# Patient Record
Sex: Female | Born: 1995 | Race: White | Hispanic: No | Marital: Single | Smoking: Never smoker
Health system: Southern US, Community
[De-identification: ages and names within clinical notes are randomized; demographics above are authoritative.]

## PROBLEM LIST (undated history)

## (undated) DIAGNOSIS — Q6689 Other  specified congenital deformities of feet: Secondary | ICD-10-CM

## (undated) HISTORY — PX: CLUB FOOT RELEASE: SHX1363

---

## 2020-02-22 ENCOUNTER — Encounter (HOSPITAL_COMMUNITY): Payer: Self-pay

## 2020-02-22 ENCOUNTER — Other Ambulatory Visit: Payer: Self-pay

## 2020-02-22 ENCOUNTER — Emergency Department (HOSPITAL_COMMUNITY): Payer: 59

## 2020-02-22 ENCOUNTER — Emergency Department (HOSPITAL_COMMUNITY)
Admission: EM | Admit: 2020-02-22 | Discharge: 2020-02-22 | Disposition: A | Discharge status: Home / Self Care | Payer: 59 | Attending: Emergency Medicine | Admitting: Emergency Medicine

## 2020-02-22 DIAGNOSIS — S82841A Displaced bimalleolar fracture of right lower leg, initial encounter for closed fracture: Secondary | ICD-10-CM | POA: Insufficient documentation

## 2020-02-22 DIAGNOSIS — S70312A Abrasion, left thigh, initial encounter: Secondary | ICD-10-CM | POA: Diagnosis not present

## 2020-02-22 DIAGNOSIS — T148XXA Other injury of unspecified body region, initial encounter: Secondary | ICD-10-CM

## 2020-02-22 DIAGNOSIS — S99911A Unspecified injury of right ankle, initial encounter: Secondary | ICD-10-CM | POA: Diagnosis present

## 2020-02-22 DIAGNOSIS — W231XXA Caught, crushed, jammed, or pinched between stationary objects, initial encounter: Secondary | ICD-10-CM | POA: Diagnosis not present

## 2020-02-22 HISTORY — DX: Other specified congenital deformities of feet: Q66.89

## 2020-02-22 MED ORDER — IBUPROFEN 400 MG PO TABS
600.0000 mg | ORAL_TABLET | Freq: Once | ORAL | Status: AC
  Administered 2020-02-22: 600 mg via ORAL
  Filled 2020-02-22: qty 1

## 2020-02-22 MED ORDER — OXYCODONE-ACETAMINOPHEN 5-325 MG PO TABS
1.0000 | ORAL_TABLET | Freq: Once | ORAL | Status: AC
  Administered 2020-02-22: 1 via ORAL
  Filled 2020-02-22: qty 1

## 2020-02-22 MED ORDER — HYDROMORPHONE HCL 1 MG/ML IJ SOLN
1.0000 mg | Freq: Once | INTRAMUSCULAR | Status: AC
  Administered 2020-02-22: 1 mg via INTRAVENOUS
  Filled 2020-02-22: qty 1

## 2020-02-22 MED ORDER — METHOCARBAMOL 500 MG PO TABS
500.0000 mg | ORAL_TABLET | Freq: Once | ORAL | Status: AC
  Administered 2020-02-22: 500 mg via ORAL
  Filled 2020-02-22: qty 1

## 2020-02-22 MED ORDER — METHOCARBAMOL 500 MG PO TABS: 500.0000 mg | ORAL_TABLET | Freq: Two times a day (BID) | ORAL | 0 refills | Status: AC

## 2020-02-22 MED ORDER — OXYCODONE-ACETAMINOPHEN 5-325 MG PO TABS
1.0000 | ORAL_TABLET | Freq: Three times a day (TID) | ORAL | 0 refills | Status: DC | PRN
Start: 2020-02-22 — End: 2020-02-29

## 2020-02-22 NOTE — Progress Notes (Signed)
Orthopedic Tech Progress Note Patient Details:  Rachelle Edwards 03-12-1996 [id number]  Ortho Devices Type of Ortho Device: Post (short leg) splint, Crutches Ortho Device/Splint Location: RLE Ortho Device/Splint Interventions: Application, Adjustment   Post Interventions Patient Tolerated: Well Instructions Provided: Care of device, Poper ambulation with device   Kamori Barbier E Keinan Brouillet 02/22/2020, 10:48 PM

## 2020-02-22 NOTE — Discharge Instructions (Addendum)
Your x-ray shows that you have a fracture of your right ankle. You are placed in a splint and you will need to use crutches as you cannot put weight on this leg. You will need to follow-up with orthopedic specialist listed below and give them a call tomorrow to schedule an appointment. Take the pain medicine as needed along with ibuprofen to help with pain and swelling. Return to the ER if you start to experience worsening pain, additional injuries, numbness or weakness.

## 2020-02-22 NOTE — ED Provider Notes (Signed)
[facility] EMERGENCY DEPARTMENT Provider Note   CSN: [id number] Arrival date & time: 02/22/20  1831     History Chief Complaint  Patient presents with  . Ankle Pain    Kayla Mora is a 24 y.o. 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  States that a brick wall fell onto her legs.  She has had immediate right ankle swelling and pain worse with weightbearing since the injury.  She denies any other procedures or dislocations in the area.  States that her boyfriend gave her a pain pill that did help.  Denies any other injuries, does have some abrasions to the left thigh and leg.  Denies any head injury, loss of consciousness, numbness or weakness. Denies possibility of pregnancy.  HPI     Past Medical History:  Diagnosis Date  . Club foot     There are no problems to display for this patient.   History reviewed. No pertinent surgical history.   OB History   No obstetric history on file.     No family history on file.  Social History   Tobacco Use  . Smoking status: Not on file  Substance Use Topics  . Alcohol use: Not on file  . Drug use: Not on file    Home Medications Prior to Admission medications   Medication Sig Start Date End Date Taking? Authorizing Provider  methocarbamol (ROBAXIN) 500 MG tablet Take 1 tablet (500 mg total) by mouth 2 (two) times daily. 02/22/20   Jalicia Roszak, PA-C  oxyCODONE-acetaminophen (PERCOCET/ROXICET) 5-325 MG tablet Take 1 tablet by mouth every 8 (eight) hours as needed for severe pain. 02/22/20   Dietrich Pates, PA-C    Allergies    Patient has no known allergies.  Review of Systems   Review of Systems  Constitutional: Negative for appetite change, chills and fever.  HENT: Negative for ear pain, rhinorrhea, sneezing and sore throat.   Eyes: Negative for photophobia and  visual disturbance.  Respiratory: Negative for cough, chest tightness, shortness of breath and wheezing.   Cardiovascular: Negative for chest pain and palpitations.  Gastrointestinal: Negative for abdominal pain, blood in stool, constipation, diarrhea, nausea and vomiting.  Genitourinary: Negative for dysuria, hematuria and urgency.  Musculoskeletal: Positive for arthralgias and joint swelling. Negative for myalgias.  Skin: Positive for wound. Negative for rash.  Neurological: Negative for dizziness, weakness and light-headedness.    Physical Exam Updated Vital Signs BP 111/84   Pulse 86   Temp 98.4 F (36.9 C) (Oral)   Resp 11   Ht 5\' 4"  (1.626 m)   Wt 56.7 kg   LMP 02/07/2020 (Approximate)   SpO2 (!) 88%   BMI 21.46 kg/m   Physical Exam Vitals and nursing note reviewed.  Constitutional:      General: She is not in acute distress.    Appearance: She is well-developed.  HENT:     Head: Normocephalic and atraumatic.     Nose: Nose normal.  Eyes:     General: No scleral icterus.       Left eye: No discharge.     Conjunctiva/sclera: Conjunctivae normal.  Cardiovascular:     Rate and Rhythm: Normal rate and regular rhythm.     Heart sounds: Normal heart sounds. No murmur heard.  No friction rub. No gallop.   Pulmonary:  Effort: Pulmonary effort is normal. No respiratory distress.     Breath sounds: Normal breath sounds.  Abdominal:     General: Bowel sounds are normal. There is no distension.     Palpations: Abdomen is soft.     Tenderness: There is no abdominal tenderness. There is no guarding.  Musculoskeletal:        General: Swelling, tenderness and deformity present. Normal range of motion.     Cervical back: Normal range of motion and neck supple.     Comments: Significant edema and tenderness of the right ankle.  There is an abrasion on the right shin and left thigh.  2+ DP pulse palpated bilaterally.  Able to move digits without difficulty. Normal sensation to  light touch of bilateral lower extremities.  Skin:    General: Skin is warm and dry.     Findings: Abrasion present. No rash.  Neurological:     Mental Status: She is alert.     Motor: No abnormal muscle tone.     Coordination: Coordination normal.       ED Results / Procedures / Treatments   Labs (all labs ordered are listed, but only abnormal results are displayed) Labs Reviewed - No data to display  EKG None  Radiology DG Ankle Complete Right  Result Date: 02/22/2020 CLINICAL DATA:  A small brick wall toppled onto the patient's right ankle and lower leg today. There is an abrasion that goes from the anterior midshaft tibia down to the lateral right ankle. Right ankle is swollen and pt c/o lateral ankle pain. Pt states she has a hx of club foot on the right side and had surgery as a small child on the right foot. EXAM: RIGHT ANKLE - COMPLETE 3+ VIEW COMPARISON:  None. FINDINGS: Bimalleolar fracture. There is an oblique fracture of the distal fibula, mildly comminuted, displaced laterally by 7 mm, distal fragment also mildly angulated laterally. Transverse fracture extends across the base of the medial malleolus, displaced medially by 4 mm. The talus is mildly subluxed laterally, 4-5 mm. No other fractures. There is surrounding soft tissue swelling. IMPRESSION: 1. Fractures of the distal fibula and medial malleolus, displaced with mild, up approximately 4-5 mm, of lateral talar subluxation. Electronically Signed   By: Amie Portland M.D.   On: 02/22/2020 20:16   DG Tibia/Fibula Right Port  Result Date: 02/22/2020 CLINICAL DATA:  History of bimalleolar fracture with reduction EXAM: PORTABLE RIGHT TIBIA AND FIBULA - 2 VIEW COMPARISON:  Films from earlier in the same day. FINDINGS: Splinting material is noted in place. The bimalleolar fracture is again identified and mildly reduced. No new focal abnormality is seen. IMPRESSION: Status post reduction and splinting of bimalleolar fracture  Electronically Signed   By: Alcide Clever M.D.   On: 02/22/2020 22:39    Procedures Procedures (including critical care time)  Medications Ordered in ED Medications  ibuprofen (ADVIL) tablet 600 mg (has no administration in time range)  oxyCODONE-acetaminophen (PERCOCET/ROXICET) 5-325 MG per tablet 1 tablet (has no administration in time range)  methocarbamol (ROBAXIN) tablet 500 mg (has no administration in time range)  HYDROmorphone (DILAUDID) injection 1 mg (1 mg Intravenous Given 02/22/20 2132)    ED Course  I have reviewed the triage vital signs and the nursing notes.  Pertinent labs & imaging results that were available during my care of the patient were reviewed by me and considered in my medical decision making (see chart for details).  Clinical Course as of Feb 21 2306  Wed Feb 22, 2020  2210 Spoke to Dr. Jena Gauss, orthopedic surgeon who recommends short leg splint and follow-up in the office to plan for surgery. No reduction needed.   [HK]    Clinical Course User Index [HK] Dietrich Pates, PA-C   MDM Rules/Calculators/A&P                          24 year old female presenting to the ED with right ankle injury that occurred prior to arrival.  She states that a brick wall fell onto her right leg.  She has had pain and swelling since then as well as difficulty putting weight on the extremity.  She did have surgery on this foot for clubfoot at the age of 1.  She denies any other injuries from the accident.  No head injury, loss of consciousness, numbness or weakness.  She has diffuse edema and tenderness of the right ankle with limited range of motion.  Areas neurovascularly intact but there is a large abrasion overlying the ankle and shin.  Equal and intact distal pulses bilaterally.  Superficial abrasions noted to the left thigh as well.  X-ray of the ankle shows right bimalleolar fracture with talar subluxation.  Per Dr. Jena Gauss recommendation we will place in a short leg splint, give  crutches and have her follow-up in the office for surgical planning.  X-ray of the tib-fib shows no other fractures.  Will discharge with crutches and follow-up instructions.  Pain controlled here.  Return precautions given.  All imaging, if done today, including plain films, CT scans, and ultrasounds, independently reviewed by me, and interpretations confirmed via formal radiology reads.  Patient is hemodynamically stable, in NAD. Evaluation does not show pathology that would require ongoing emergent intervention or inpatient treatment. I explained the diagnosis to the patient. Pain has been managed and has no complaints prior to discharge. Patient is comfortable with above plan and is stable for discharge at this time. All questions were answered prior to disposition. Strict return precautions for returning to the ED were discussed. Encouraged follow up with PCP.   An After Visit Summary was printed and given to the patient.   Portions of this note were generated with Scientist, clinical (histocompatibility and immunogenetics). Dictation errors may occur despite best attempts at proofreading.  Final Clinical Impression(s) / ED Diagnoses Final diagnoses:  Fracture  Closed bimalleolar fracture of right ankle, initial encounter    Rx / DC Orders ED Discharge Orders         Ordered    oxyCODONE-acetaminophen (PERCOCET/ROXICET) 5-325 MG tablet  Every 8 hours PRN        02/22/20 2302    methocarbamol (ROBAXIN) 500 MG tablet  2 times daily        02/22/20 2302           Dietrich Pates, PA-C 02/22/20 2307    Milagros Loll, MD 02/23/20 1222

## 2020-02-22 NOTE — ED Notes (Signed)
Patient verbalizes understanding of discharge instructions. Opportunity for questioning and answers were provided. Armband removed by staff, pt discharged from ED via wheelchair.  

## 2020-02-22 NOTE — ED Triage Notes (Signed)
Pt states that a brick wall fell onto her legs, lots of swelling to R ankle and abrasion noted.

## 2020-02-23 ENCOUNTER — Ambulatory Visit (INDEPENDENT_AMBULATORY_CARE_PROVIDER_SITE_OTHER): Payer: 59 | Admitting: Orthopaedic Surgery

## 2020-02-23 ENCOUNTER — Encounter (HOSPITAL_BASED_OUTPATIENT_CLINIC_OR_DEPARTMENT_OTHER): Payer: Self-pay | Admitting: Orthopaedic Surgery

## 2020-02-23 ENCOUNTER — Encounter: Payer: Self-pay | Admitting: Orthopaedic Surgery

## 2020-02-23 ENCOUNTER — Other Ambulatory Visit: Payer: Self-pay

## 2020-02-23 DIAGNOSIS — S82841A Displaced bimalleolar fracture of right lower leg, initial encounter for closed fracture: Secondary | ICD-10-CM | POA: Diagnosis not present

## 2020-02-23 MED ORDER — HYDROCODONE-ACETAMINOPHEN 7.5-325 MG PO TABS
1.0000 | ORAL_TABLET | Freq: Two times a day (BID) | ORAL | 0 refills | Status: DC | PRN
Start: 2020-02-23 — End: 2020-02-29

## 2020-02-23 NOTE — Progress Notes (Signed)
Office Visit Note   Patient: Kayla Mora           Date of Birth: 12-15-1995           MRN: [id number] Visit Date: 02/23/2020              Requested by: No referring provider defined for this encounter. PCP: Patient, No Pcp Per   Assessment & Plan: Visit Diagnoses:  1. Bimalleolar ankle fracture, right, closed, initial encounter     Plan: Impression is displaced right bimalleolar ankle fracture.  This will need surgical repair and stabilization.  We went over the risks benefits rehab recovery of the surgery.  We placed her back in the splint.  Documentation was given for her upcoming flight to [place].  We will plan to do the surgery next week once the swelling has improved.  Norco was prescribed today.  She will keep her leg elevated above the heart at all times.  Questions encouraged and answered.  Follow-Up Instructions: No follow-ups on file.   Orders:  No orders of the defined types were placed in this encounter.  Meds ordered this encounter  Medications   HYDROcodone-acetaminophen (NORCO) 7.5-325 MG tablet    Sig: Take 1-2 tablets by mouth 2 (two) times daily as needed for moderate pain.    Dispense:  20 tablet    Refill:  0      Procedures: No procedures performed   Clinical Data: No additional findings.   Subjective: Chief Complaint  Patient presents with   Right Ankle - Injury    DOI 02/22/2020    Kayla Mora is a very pleasant 24 year old female ER follow-up from last night for acute injury to the right ankle.  She had a retaining brick wall fall directly onto her right leg and she suffered a bimalleolar ankle fracture with multiple skin abrasions.  She was placed in a splint in the ER as she follows up today.  She has had clubfoot surgery as a child.  She is a normal ambulator.  She does work from home.   Review of Systems  Constitutional: Negative.   HENT: Negative.   Eyes: Negative.   Respiratory: Negative.   Cardiovascular: Negative.   Endocrine:  Negative.   Musculoskeletal: Negative.   Neurological: Negative.   Hematological: Negative.   Psychiatric/Behavioral: Negative.   All other systems reviewed and are negative.    Objective: Vital Signs: Ht 5\' 4"  (1.626 m)    Wt 125 lb (56.7 kg)    LMP 02/07/2020 (Approximate)    BMI 21.46 kg/m   Physical Exam Vitals and nursing note reviewed.  Constitutional:      Appearance: She is well-developed.  Pulmonary:     Effort: Pulmonary effort is normal.  Skin:    General: Skin is warm.     Capillary Refill: Capillary refill takes less than 2 seconds.  Neurological:     Mental Status: She is alert and oriented to person, place, and time.  Psychiatric:        Behavior: Behavior normal.        Thought Content: Thought content normal.        Judgment: Judgment normal.     Ortho Exam Right lower extremity shows large anterior abrasion to the leg without any signs of infection.  The ankle fracture is closed.  She does have significant swelling.  The skin does not wrinkle.  No neurovascular compromise.  Compartments are soft. Specialty Comments:  No specialty comments available.  Imaging:  DG Ankle Complete Right  Result Date: 02/22/2020 CLINICAL DATA:  A small brick wall toppled onto the patient's right ankle and lower leg today. There is an abrasion that goes from the anterior midshaft tibia down to the lateral right ankle. Right ankle is swollen and pt c/o lateral ankle pain. Pt states she has a hx of club foot on the right side and had surgery as a small child on the right foot. EXAM: RIGHT ANKLE - COMPLETE 3+ VIEW COMPARISON:  None. FINDINGS: Bimalleolar fracture. There is an oblique fracture of the distal fibula, mildly comminuted, displaced laterally by 7 mm, distal fragment also mildly angulated laterally. Transverse fracture extends across the base of the medial malleolus, displaced medially by 4 mm. The talus is mildly subluxed laterally, 4-5 mm. No other fractures. There is  surrounding soft tissue swelling. IMPRESSION: 1. Fractures of the distal fibula and medial malleolus, displaced with mild, up approximately 4-5 mm, of lateral talar subluxation. Electronically Signed   By: Amie Portland M.D.   On: 02/22/2020 20:16   DG Tibia/Fibula Right Port  Result Date: 02/22/2020 CLINICAL DATA:  History of bimalleolar fracture with reduction EXAM: PORTABLE RIGHT TIBIA AND FIBULA - 2 VIEW COMPARISON:  Films from earlier in the same day. FINDINGS: Splinting material is noted in place. The bimalleolar fracture is again identified and mildly reduced. No new focal abnormality is seen. IMPRESSION: Status post reduction and splinting of bimalleolar fracture Electronically Signed   By: Alcide Clever M.D.   On: 02/22/2020 22:39     PMFS History: Patient Active Problem List   Diagnosis Date Noted   Bimalleolar ankle fracture, right, closed, initial encounter 02/23/2020   Past Medical History:  Diagnosis Date   Club foot     History reviewed. No pertinent family history.  History reviewed. No pertinent surgical history. Social History   Occupational History   Not on file  Tobacco Use   Smoking status: Not on file  Substance and Sexual Activity   Alcohol use: Not on file   Drug use: Not on file   Sexual activity: Not on file

## 2020-02-24 ENCOUNTER — Other Ambulatory Visit: Payer: Self-pay

## 2020-02-25 ENCOUNTER — Other Ambulatory Visit (HOSPITAL_COMMUNITY)
Admission: RE | Admit: 2020-02-25 | Discharge: 2020-02-25 | Disposition: A | Discharge status: Home / Self Care | Payer: 59 | Source: Ambulatory Visit | Attending: Orthopaedic Surgery | Admitting: Orthopaedic Surgery

## 2020-02-25 DIAGNOSIS — Z20822 Contact with and (suspected) exposure to covid-19: Secondary | ICD-10-CM | POA: Diagnosis not present

## 2020-02-25 DIAGNOSIS — Z01812 Encounter for preprocedural laboratory examination: Secondary | ICD-10-CM | POA: Insufficient documentation

## 2020-02-25 LAB — SARS CORONAVIRUS 2 (TAT 6-24 HRS): SARS Coronavirus 2: NEGATIVE

## 2020-02-29 ENCOUNTER — Encounter (HOSPITAL_BASED_OUTPATIENT_CLINIC_OR_DEPARTMENT_OTHER): Payer: Self-pay | Admitting: Orthopaedic Surgery

## 2020-02-29 ENCOUNTER — Other Ambulatory Visit: Payer: Self-pay

## 2020-02-29 ENCOUNTER — Ambulatory Visit (HOSPITAL_BASED_OUTPATIENT_CLINIC_OR_DEPARTMENT_OTHER): Payer: 59 | Admitting: Anesthesiology

## 2020-02-29 ENCOUNTER — Ambulatory Visit (HOSPITAL_COMMUNITY): Payer: 59

## 2020-02-29 ENCOUNTER — Encounter (HOSPITAL_BASED_OUTPATIENT_CLINIC_OR_DEPARTMENT_OTHER)
Admission: RE | Disposition: A | Discharge status: Home / Self Care | Payer: Self-pay | Source: Home / Self Care | Attending: Orthopaedic Surgery

## 2020-02-29 ENCOUNTER — Ambulatory Visit (HOSPITAL_BASED_OUTPATIENT_CLINIC_OR_DEPARTMENT_OTHER)
Admission: RE | Admit: 2020-02-29 | Discharge: 2020-02-29 | Disposition: A | Discharge status: Home / Self Care | Payer: 59 | Attending: Orthopaedic Surgery | Admitting: Orthopaedic Surgery

## 2020-02-29 DIAGNOSIS — X58XXXA Exposure to other specified factors, initial encounter: Secondary | ICD-10-CM | POA: Diagnosis not present

## 2020-02-29 DIAGNOSIS — Z419 Encounter for procedure for purposes other than remedying health state, unspecified: Secondary | ICD-10-CM

## 2020-02-29 DIAGNOSIS — S82841A Displaced bimalleolar fracture of right lower leg, initial encounter for closed fracture: Secondary | ICD-10-CM | POA: Insufficient documentation

## 2020-02-29 HISTORY — PX: ORIF ANKLE FRACTURE: SHX5408

## 2020-02-29 LAB — POCT PREGNANCY, URINE: Preg Test, Ur: NEGATIVE

## 2020-02-29 SURGERY — OPEN REDUCTION INTERNAL FIXATION (ORIF) ANKLE FRACTURE
Anesthesia: Regional | Site: Ankle | Laterality: Right

## 2020-02-29 MED ORDER — PROPOFOL 500 MG/50ML IV EMUL
INTRAVENOUS | Status: AC
  Filled 2020-02-29: qty 50

## 2020-02-29 MED ORDER — PROPOFOL 10 MG/ML IV BOLUS
INTRAVENOUS | Status: DC | PRN
  Administered 2020-02-29: 125 mg via INTRAVENOUS
  Administered 2020-02-29: 75 mg via INTRAVENOUS

## 2020-02-29 MED ORDER — LACTATED RINGERS IV SOLN: INTRAVENOUS | Status: DC

## 2020-02-29 MED ORDER — MIDAZOLAM HCL 2 MG/2ML IJ SOLN
2.0000 mg | Freq: Once | INTRAMUSCULAR | Status: AC
  Administered 2020-02-29: 2 mg via INTRAVENOUS

## 2020-02-29 MED ORDER — PROPOFOL 500 MG/50ML IV EMUL
INTRAVENOUS | Status: DC | PRN
  Administered 2020-02-29: 25 ug/kg/min via INTRAVENOUS

## 2020-02-29 MED ORDER — DEXAMETHASONE SODIUM PHOSPHATE 10 MG/ML IJ SOLN
INTRAMUSCULAR | Status: AC
  Filled 2020-02-29: qty 1

## 2020-02-29 MED ORDER — FENTANYL CITRATE (PF) 100 MCG/2ML IJ SOLN
100.0000 ug | Freq: Once | INTRAMUSCULAR | Status: AC
  Administered 2020-02-29: 100 ug via INTRAVENOUS

## 2020-02-29 MED ORDER — HYDROCODONE-ACETAMINOPHEN 7.5-325 MG PO TABS
1.0000 | ORAL_TABLET | Freq: Three times a day (TID) | ORAL | 0 refills | Status: DC | PRN
Start: 2020-02-29 — End: 2020-03-08

## 2020-02-29 MED ORDER — FENTANYL CITRATE (PF) 100 MCG/2ML IJ SOLN
INTRAMUSCULAR | Status: DC | PRN
  Administered 2020-02-29 (×2): 50 ug via INTRAVENOUS

## 2020-02-29 MED ORDER — CEFAZOLIN SODIUM-DEXTROSE 2-4 GM/100ML-% IV SOLN
INTRAVENOUS | Status: AC
  Filled 2020-02-29: qty 100

## 2020-02-29 MED ORDER — CALCIUM CARBONATE-VITAMIN D 500-200 MG-UNIT PO TABS: 1.0000 | ORAL_TABLET | Freq: Three times a day (TID) | ORAL | 6 refills | Status: AC

## 2020-02-29 MED ORDER — 0.9 % SODIUM CHLORIDE (POUR BTL) OPTIME
TOPICAL | Status: DC | PRN
  Administered 2020-02-29: 1000 mL

## 2020-02-29 MED ORDER — MIDAZOLAM HCL 5 MG/5ML IJ SOLN
INTRAMUSCULAR | Status: DC | PRN
  Administered 2020-02-29 (×2): 1 mg via INTRAVENOUS

## 2020-02-29 MED ORDER — LIDOCAINE 2% (20 MG/ML) 5 ML SYRINGE
INTRAMUSCULAR | Status: AC
  Filled 2020-02-29: qty 5

## 2020-02-29 MED ORDER — GLYCOPYRROLATE 0.2 MG/ML IJ SOLN
INTRAMUSCULAR | Status: DC | PRN
  Administered 2020-02-29: .2 mg via INTRAVENOUS

## 2020-02-29 MED ORDER — ASPIRIN EC 81 MG PO TBEC: 81.0000 mg | DELAYED_RELEASE_TABLET | Freq: Two times a day (BID) | ORAL | 0 refills | Status: DC

## 2020-02-29 MED ORDER — FENTANYL CITRATE (PF) 100 MCG/2ML IJ SOLN
INTRAMUSCULAR | Status: AC
  Filled 2020-02-29: qty 2

## 2020-02-29 MED ORDER — MIDAZOLAM HCL 2 MG/2ML IJ SOLN
INTRAMUSCULAR | Status: AC
  Filled 2020-02-29: qty 2

## 2020-02-29 MED ORDER — BUPIVACAINE-EPINEPHRINE (PF) 0.5% -1:200000 IJ SOLN
INTRAMUSCULAR | Status: DC | PRN
  Administered 2020-02-29: 25 mL via PERINEURAL
  Administered 2020-02-29: 10 mL via PERINEURAL

## 2020-02-29 MED ORDER — CELECOXIB 200 MG PO CAPS
200.0000 mg | ORAL_CAPSULE | Freq: Once | ORAL | Status: AC
  Administered 2020-02-29: 200 mg via ORAL

## 2020-02-29 MED ORDER — ACETAMINOPHEN 500 MG PO TABS
ORAL_TABLET | ORAL | Status: AC
  Filled 2020-02-29: qty 2

## 2020-02-29 MED ORDER — CELECOXIB 200 MG PO CAPS
ORAL_CAPSULE | ORAL | Status: AC
  Filled 2020-02-29: qty 1

## 2020-02-29 MED ORDER — LIDOCAINE HCL (CARDIAC) PF 100 MG/5ML IV SOSY
PREFILLED_SYRINGE | INTRAVENOUS | Status: DC | PRN
  Administered 2020-02-29: 40 mg via INTRAVENOUS

## 2020-02-29 MED ORDER — CEFAZOLIN SODIUM-DEXTROSE 2-4 GM/100ML-% IV SOLN
2.0000 g | INTRAVENOUS | Status: AC
  Administered 2020-02-29: 2 g via INTRAVENOUS

## 2020-02-29 MED ORDER — DEXAMETHASONE SODIUM PHOSPHATE 10 MG/ML IJ SOLN
INTRAMUSCULAR | Status: DC | PRN
  Administered 2020-02-29: 10 mg via INTRAVENOUS

## 2020-02-29 MED ORDER — ACETAMINOPHEN 500 MG PO TABS
1000.0000 mg | ORAL_TABLET | Freq: Once | ORAL | Status: AC
  Administered 2020-02-29: 1000 mg via ORAL

## 2020-02-29 MED ORDER — ONDANSETRON HCL 4 MG/2ML IJ SOLN
INTRAMUSCULAR | Status: AC
  Filled 2020-02-29: qty 2

## 2020-02-29 MED ORDER — HYDROMORPHONE HCL 1 MG/ML IJ SOLN: 0.2500 mg | INTRAMUSCULAR | Status: DC | PRN

## 2020-02-29 MED ORDER — ONDANSETRON HCL 4 MG/2ML IJ SOLN
INTRAMUSCULAR | Status: DC | PRN
  Administered 2020-02-29: 4 mg via INTRAVENOUS

## 2020-02-29 MED ORDER — GLYCOPYRROLATE 0.2 MG/ML IJ SOLN
INTRAMUSCULAR | Status: AC
  Filled 2020-02-29: qty 2

## 2020-02-29 SURGICAL SUPPLY — 100 items
BANDAGE ESMARK 6X9 LF (GAUZE/BANDAGES/DRESSINGS) ×1 IMPLANT
BIT DRILL 2.7 QC CANN 155 (BIT) ×2 IMPLANT
BIT DRILL 2.7 QC CANN 155MM (BIT) ×1
BIT DRILL QC 2.0 SHORT EVOS SM (DRILL) ×1 IMPLANT
BIT DRILL QC 2.5MM SHRT EVO SM (DRILL) ×1 IMPLANT
BLADE HEX COATED 2.75 (ELECTRODE) IMPLANT
BLADE SURG 15 STRL LF DISP TIS (BLADE) ×2 IMPLANT
BLADE SURG 15 STRL SS (BLADE) ×4
BNDG COHESIVE 6X5 TAN STRL LF (GAUZE/BANDAGES/DRESSINGS) ×3 IMPLANT
BNDG ELASTIC 4X5.8 VLCR STR LF (GAUZE/BANDAGES/DRESSINGS) ×3 IMPLANT
BNDG ELASTIC 6X5.8 VLCR STR LF (GAUZE/BANDAGES/DRESSINGS) ×3 IMPLANT
BNDG ESMARK 6X9 LF (GAUZE/BANDAGES/DRESSINGS) ×3
BRUSH SCRUB EZ PLAIN DRY (MISCELLANEOUS) ×3 IMPLANT
CANISTER SUCT 1200ML W/VALVE (MISCELLANEOUS) ×3 IMPLANT
COVER BACK TABLE 60X90IN (DRAPES) ×3 IMPLANT
COVER MAYO STAND STRL (DRAPES) IMPLANT
COVER WAND RF STERILE (DRAPES) IMPLANT
CUFF TOURN SGL QUICK 34 (TOURNIQUET CUFF) ×2
CUFF TRNQT CYL 34X4.125X (TOURNIQUET CUFF) ×1 IMPLANT
DECANTER SPIKE VIAL GLASS SM (MISCELLANEOUS) IMPLANT
DRAPE C-ARM 42X72 X-RAY (DRAPES) ×3 IMPLANT
DRAPE C-ARMOR (DRAPES) ×3 IMPLANT
DRAPE EXTREMITY T 121X128X90 (DISPOSABLE) ×3 IMPLANT
DRAPE IMP U-DRAPE 54X76 (DRAPES) ×3 IMPLANT
DRAPE INCISE IOBAN 66X45 STRL (DRAPES) IMPLANT
DRAPE SURG 17X23 STRL (DRAPES) ×6 IMPLANT
DRAPE U-SHAPE 47X51 STRL (DRAPES) ×3 IMPLANT
DRILL QC 2.0 SHORT EVOS SM (DRILL) ×3
DRILL QC 2.5MM SHORT EVOS SM (DRILL) ×3
DRSG PAD ABDOMINAL 8X10 ST (GAUZE/BANDAGES/DRESSINGS) ×6 IMPLANT
DURAPREP 26ML APPLICATOR (WOUND CARE) ×6 IMPLANT
ELECT REM PT RETURN 9FT ADLT (ELECTROSURGICAL) ×3
ELECTRODE REM PT RTRN 9FT ADLT (ELECTROSURGICAL) ×1 IMPLANT
GAUZE SPONGE 4X4 12PLY STRL (GAUZE/BANDAGES/DRESSINGS) ×3 IMPLANT
GAUZE XEROFORM 1X8 LF (GAUZE/BANDAGES/DRESSINGS) ×3 IMPLANT
GAUZE XEROFORM 5X9 LF (GAUZE/BANDAGES/DRESSINGS) ×3 IMPLANT
GLOVE BIO SURGEON STRL SZ 6.5 (GLOVE) ×2 IMPLANT
GLOVE BIO SURGEONS STRL SZ 6.5 (GLOVE) ×1
GLOVE BIOGEL PI IND STRL 7.0 (GLOVE) IMPLANT
GLOVE BIOGEL PI INDICATOR 7.0 (GLOVE)
GLOVE SKINSENSE NS SZ7.5 (GLOVE) ×2
GLOVE SKINSENSE STRL SZ7.5 (GLOVE) ×1 IMPLANT
GLOVE SURG LTX SZ7 (GLOVE) IMPLANT
GLOVE SURG SYN 7.5  E (GLOVE) ×6
GLOVE SURG SYN 7.5 E (GLOVE) ×3 IMPLANT
GLOVE SURG UNDER POLY LF SZ7 (GLOVE) ×3 IMPLANT
GOWN STRL REIN XL XLG (GOWN DISPOSABLE) ×3 IMPLANT
GOWN STRL REUS W/ TWL LRG LVL3 (GOWN DISPOSABLE) ×1 IMPLANT
GOWN STRL REUS W/ TWL XL LVL3 (GOWN DISPOSABLE) ×1 IMPLANT
GOWN STRL REUS W/TWL LRG LVL3 (GOWN DISPOSABLE) ×2
GOWN STRL REUS W/TWL XL LVL3 (GOWN DISPOSABLE) ×2
GUIDE PIN 1.3 (PIN) ×6
K-WIRE 1.6 (WIRE) ×2
K-WIRE FX150X1.6XTROC PNT (WIRE) ×1
KWIRE FX150X1.6XTROC PNT (WIRE) ×1 IMPLANT
MANIFOLD NEPTUNE II (INSTRUMENTS) ×3 IMPLANT
NEEDLE HYPO 22GX1.5 SAFETY (NEEDLE) IMPLANT
NS IRRIG 1000ML POUR BTL (IV SOLUTION) ×3 IMPLANT
PACK BASIN DAY SURGERY FS (CUSTOM PROCEDURE TRAY) ×3 IMPLANT
PAD CAST 3X4 CTTN HI CHSV (CAST SUPPLIES) IMPLANT
PAD CAST 4YDX4 CTTN HI CHSV (CAST SUPPLIES) IMPLANT
PADDING CAST COTTON 3X4 STRL (CAST SUPPLIES)
PADDING CAST COTTON 4X4 STRL (CAST SUPPLIES)
PADDING CAST COTTON 6X4 STRL (CAST SUPPLIES) IMPLANT
PADDING CAST SYN 6 (CAST SUPPLIES) ×2
PADDING CAST SYNTHETIC 4 (CAST SUPPLIES) ×4
PADDING CAST SYNTHETIC 4X4 STR (CAST SUPPLIES) ×2 IMPLANT
PADDING CAST SYNTHETIC 6X4 NS (CAST SUPPLIES) ×1 IMPLANT
PENCIL SMOKE EVACUATOR (MISCELLANEOUS) ×3 IMPLANT
PIN GUIDE 1.3 (PIN) ×2 IMPLANT
PLATE FIB EVOS 81X2.7/3.5 5H (Plate) ×3 IMPLANT
SCREW CANN 6XFT 40X4X2.7X (Screw) ×1 IMPLANT
SCREW CANN2.5XFLUT 38X14X4 (Screw) ×1 IMPLANT
SCREW CANNULATED 4.0X38 (Screw) ×2 IMPLANT
SCREW CANNULATED 4.0X40 (Screw) ×2 IMPLANT
SCREW CORT 2.7X14 T8 EVOS (Screw) ×6 IMPLANT
SCREW CORT 3.5X10MM ST EVOS (Screw) ×9 IMPLANT
SHEET MEDIUM DRAPE 40X70 STRL (DRAPES) ×9 IMPLANT
SLEEVE SCD COMPRESS KNEE MED (MISCELLANEOUS) ×3 IMPLANT
SPLINT FIBERGLASS 3X35 (CAST SUPPLIES) ×3 IMPLANT
SPLINT FIBERGLASS 4X30 (CAST SUPPLIES) ×3 IMPLANT
SPONGE LAP 18X18 RF (DISPOSABLE) ×3 IMPLANT
STOCKINETTE TUBULAR 6 INCH (GAUZE/BANDAGES/DRESSINGS) ×3 IMPLANT
SUCTION FRAZIER HANDLE 10FR (MISCELLANEOUS) ×2
SUCTION TUBE FRAZIER 10FR DISP (MISCELLANEOUS) ×1 IMPLANT
SUT ETHILON 3 0 PS 1 (SUTURE) ×6 IMPLANT
SUT VIC AB 0 CT1 27 (SUTURE)
SUT VIC AB 0 CT1 27XBRD ANBCTR (SUTURE) IMPLANT
SUT VIC AB 2-0 CT1 27 (SUTURE) ×4
SUT VIC AB 2-0 CT1 TAPERPNT 27 (SUTURE) ×2 IMPLANT
SUT VIC AB 3-0 SH 27 (SUTURE)
SUT VIC AB 3-0 SH 27X BRD (SUTURE) IMPLANT
SYR BULB EAR ULCER 3OZ GRN STR (SYRINGE) IMPLANT
SYR CONTROL 10ML LL (SYRINGE) IMPLANT
TOWEL GREEN STERILE FF (TOWEL DISPOSABLE) ×3 IMPLANT
TRAY DSU PREP LF (CUSTOM PROCEDURE TRAY) ×3 IMPLANT
TUBE CONNECTING 20'X1/4 (TUBING) ×1
TUBE CONNECTING 20X1/4 (TUBING) ×2 IMPLANT
UNDERPAD 30X36 HEAVY ABSORB (UNDERPADS AND DIAPERS) ×3 IMPLANT
YANKAUER SUCT BULB TIP NO VENT (SUCTIONS) ×3 IMPLANT

## 2020-02-29 NOTE — Anesthesia Preprocedure Evaluation (Addendum)
Anesthesia Evaluation  Patient identified by MRN, date of birth, ID band Patient awake    Reviewed: Allergy & Precautions, H&P , NPO status , Patient's Chart, lab work & pertinent test results  Airway Mallampati: II  TM Distance: >3 FB Neck ROM: Full    Dental no notable dental hx. (+) Teeth Intact, Dental Advisory Given   Pulmonary neg pulmonary ROS,    Pulmonary exam normal breath sounds clear to auscultation       Cardiovascular negative cardio ROS   Rhythm:Regular Rate:Normal     Neuro/Psych negative neurological ROS  negative psych ROS   GI/Hepatic negative GI ROS, Neg liver ROS,   Endo/Other  negative endocrine ROS  Renal/GU negative Renal ROS  negative genitourinary   Musculoskeletal   Abdominal   Peds  Hematology negative hematology ROS (+)   Anesthesia Other Findings   Reproductive/Obstetrics negative OB ROS                            Anesthesia Physical Anesthesia Plan  ASA: I  Anesthesia Plan: General   Post-op Pain Management:  Regional for Post-op pain   Induction: Intravenous  PONV Risk Score and Plan: 3 and Ondansetron, Dexamethasone and Midazolam  Airway Management Planned: LMA  Additional Equipment:   Intra-op Plan:   Post-operative Plan: Extubation in OR  Informed Consent: I have reviewed the patients History and Physical, chart, labs and discussed the procedure including the risks, benefits and alternatives for the proposed anesthesia with the patient or authorized representative who has indicated his/her understanding and acceptance.     Dental advisory given  Plan Discussed with: CRNA  Anesthesia Plan Comments:         Anesthesia Quick Evaluation

## 2020-02-29 NOTE — Anesthesia Procedure Notes (Signed)
Anesthesia Regional Block: Popliteal block   Pre-Anesthetic Checklist: ,, timeout performed, Correct Patient, Correct Site, Correct Laterality, Correct Procedure, Correct Position, site marked, Risks and benefits discussed, pre-op evaluation,  At surgeon's request and post-op pain management  Laterality: Right  Prep: Maximum Sterile Barrier Precautions used, chloraprep       Needles:  Injection technique: Single-shot  Needle Type: Echogenic Stimulator Needle     Needle Length: 9cm  Needle Gauge: 21     Additional Needles:   Procedures:,,,, ultrasound used (permanent image in chart),,,,  Narrative:  Start time: 02/29/2020 12:42 PM End time: 02/29/2020 12:52 PM Injection made incrementally with aspirations every 5 mL. Anesthesiologist: Gaynelle Adu, MD  Additional Notes: 2% Lidocaine skin wheel. Adductor canal block with 10cc of 0.5% Bupivicaine w/1:200k epi.

## 2020-02-29 NOTE — Discharge Instructions (Signed)
    1. Keep splint clean and dry 2. Elevate foot above level of the heart 3. Take aspirin to prevent blood clots 4. Take pain meds as needed 5. Strict non weight bearing to operative extremity  NO TYLENOL OR IBUPROFEN BEFORE 6:15pm, IF NEEDED  Post Anesthesia Home Care Instructions  Activity: Get plenty of rest for the remainder of the day. A responsible individual must stay with you for 24 hours following the procedure.  For the next 24 hours, DO NOT: -Drive a car -Advertising copywriter -Drink alcoholic beverages -Take any medication unless instructed by your physician -Make any legal decisions or sign important papers.  Meals: Start with liquid foods such as gelatin or soup. Progress to regular foods as tolerated. Avoid greasy, spicy, heavy foods. If nausea and/or vomiting occur, drink only clear liquids until the nausea and/or vomiting subsides. Call your physician if vomiting continues.  Special Instructions/Symptoms: Your throat may feel dry or sore from the anesthesia or the breathing tube placed in your throat during surgery. If this causes discomfort, gargle with warm salt water. The discomfort should disappear within 24 hours.  If you had a scopolamine patch placed behind your ear for the management of post- operative nausea and/or vomiting:  1. The medication in the patch is effective for 72 hours, after which it should be removed.  Wrap patch in a tissue and discard in the trash. Wash hands thoroughly with soap and water. 2. You may remove the patch earlier than 72 hours if you experience unpleasant side effects which may include dry mouth, dizziness or visual disturbances. 3. Avoid touching the patch. Wash your hands with soap and water after contact with the patch.    Regional Anesthesia Blocks  1. Numbness or the inability to move the "blocked" extremity may last from 3-48 hours after placement. The length of time depends on the medication injected and your individual  response to the medication. If the numbness is not going away after 48 hours, call your surgeon.  2. The extremity that is blocked will need to be protected until the numbness is gone and the  Strength has returned. Because you cannot feel it, you will need to take extra care to avoid injury. Because it may be weak, you may have difficulty moving it or using it. You may not know what position it is in without looking at it while the block is in effect.  3. For blocks in the legs and feet, returning to weight bearing and walking needs to be done carefully. You will need to wait until the numbness is entirely gone and the strength has returned. You should be able to move your leg and foot normally before you try and bear weight or walk. You will need someone to be with you when you first try to ensure you do not fall and possibly risk injury.  4. Bruising and tenderness at the needle site are common side effects and will resolve in a few days.  5. Persistent numbness or new problems with movement should be communicated to the surgeon or the [facility] [phone] [facility] [phone]).

## 2020-02-29 NOTE — Progress Notes (Signed)
Assisted Dr. Edmond Fitzgerald with right, ultrasound guided, popliteal, adductor canal block. Side rails up, monitors on throughout procedure. See vital signs in flow sheet. Tolerated Procedure well. °

## 2020-02-29 NOTE — H&P (Signed)
PREOPERATIVE H&P  Chief Complaint: right ankle bimalleolar ankle fracture  HPI: Kayla Mora is a 24 y.o. female who presents for surgical treatment of right ankle bimalleolar ankle fracture.  She denies any changes in medical history.  Past Medical History:  Diagnosis Date  . Club foot    Past Surgical History:  Procedure Laterality Date  . CLUB FOOT RELEASE     Social History   Socioeconomic History  . Marital status: Single    Spouse name: Not on file  . Number of children: Not on file  . Years of education: Not on file  . Highest education level: Not on file  Occupational History  . Not on file  Tobacco Use  . Smoking status: Never Smoker  . Smokeless tobacco: Never Used  Substance and Sexual Activity  . Alcohol use: Yes    Comment: rare  . Drug use: Never  . Sexual activity: Not on file  Other Topics Concern  . Not on file  Social History Narrative  . Not on file   Social Determinants of Health   Financial Resource Strain:   . Difficulty of Paying Living Expenses: Not on file  Food Insecurity:   . Worried About Programme researcher, broadcasting/film/video in the Last Year: Not on file  . Ran Out of Food in the Last Year: Not on file  Transportation Needs:   . Lack of Transportation (Medical): Not on file  . Lack of Transportation (Non-Medical): Not on file  Physical Activity:   . Days of Exercise per Week: Not on file  . Minutes of Exercise per Session: Not on file  Stress:   . Feeling of Stress : Not on file  Social Connections:   . Frequency of Communication with Friends and Family: Not on file  . Frequency of Social Gatherings with Friends and Family: Not on file  . Attends Religious Services: Not on file  . Active Member of Clubs or Organizations: Not on file  . Attends Banker Meetings: Not on file  . Marital Status: Not on file   History reviewed. No pertinent family history. No Known Allergies Prior to Admission medications   Medication Sig  Start Date End Date Taking? Authorizing Provider  HYDROcodone-acetaminophen (NORCO) 7.5-325 MG tablet Take 1-2 tablets by mouth 2 (two) times daily as needed for moderate pain. 02/23/20  Yes Tarry Kos, MD  methocarbamol (ROBAXIN) 500 MG tablet Take 1 tablet (500 mg total) by mouth 2 (two) times daily. 02/22/20  Yes Khatri, Hina, PA-C  oxyCODONE-acetaminophen (PERCOCET/ROXICET) 5-325 MG tablet Take 1 tablet by mouth every 8 (eight) hours as needed for severe pain. 02/22/20  Yes Khatri, Hina, PA-C     Positive ROS: All other systems have been reviewed and were otherwise negative with the exception of those mentioned in the HPI and as above.  Physical Exam: General: Alert, no acute distress Cardiovascular: No pedal edema Respiratory: No cyanosis, no use of accessory musculature GI: abdomen soft Skin: No lesions in the area of chief complaint Neurologic: Sensation intact distally Psychiatric: Patient is competent for consent with normal mood and affect Lymphatic: no lymphedema  MUSCULOSKELETAL: exam stable  Assessment: right ankle bimalleolar ankle fracture  Plan: Plan for Procedure(s): OPEN REDUCTION INTERNAL FIXATION (ORIF) RIGHT BIMALLEOLAR ANKLE FRACTURE  The risks benefits and alternatives were discussed with the patient including but not limited to the risks of nonoperative treatment, versus surgical intervention including infection, bleeding, nerve injury,  blood clots, cardiopulmonary complications,  morbidity, mortality, among others, and they were willing to proceed.   Preoperative templating of the joint replacement has been completed, documented, and submitted to the Operating Room personnel in order to optimize intra-operative equipment management.   Glee Arvin, MD 02/29/2020 12:51 PM

## 2020-02-29 NOTE — Anesthesia Procedure Notes (Signed)
Procedure Name: LMA Insertion Date/Time: 02/29/2020 1:14 PM Performed by: Salomon Mast, CRNA Pre-anesthesia Checklist: Patient identified, Emergency Drugs available, Suction available and Patient being monitored Patient Re-evaluated:Patient Re-evaluated prior to induction Oxygen Delivery Method: Circle system utilized Preoxygenation: Pre-oxygenation with 100% oxygen Induction Type: IV induction Ventilation: Mask ventilation without difficulty LMA: LMA inserted LMA Size: 4.0 Number of attempts: 1 Placement Confirmation: positive ETCO2 and breath sounds checked- equal and bilateral Tube secured with: Tape Dental Injury: Teeth and Oropharynx as per pre-operative assessment

## 2020-02-29 NOTE — Transfer of Care (Signed)
Immediate Anesthesia Transfer of Care Note  Patient: MELONEE GERSTEL  Procedure(s) Performed: OPEN REDUCTION INTERNAL FIXATION (ORIF) RIGHT BIMALLEOLAR ANKLE FRACTURE (Right Ankle)  Patient Location: PACU  Anesthesia Type:General and Regional  Level of Consciousness: awake, oriented and patient cooperative  Airway & Oxygen Therapy: Patient Spontanous Breathing and Patient connected to face mask oxygen  Post-op Assessment: Report given to RN, Post -op Vital signs reviewed and stable, Patient moving all extremities X 4 and Patient able to stick tongue midline  Post vital signs: Reviewed and stable  Last Vitals:  Vitals Value Taken Time  BP 109/54 02/29/20 1447  Temp    Pulse 120 02/29/20 1449  Resp 13 02/29/20 1449  SpO2 100 % 02/29/20 1449  Vitals shown include unvalidated device data.  Last Pain:  Vitals:   02/29/20 1212  TempSrc: Oral  PainSc: 2       Patients Stated Pain Goal: 3 (02/29/20 1212)  Complications: No complications documented.

## 2020-03-01 NOTE — Op Note (Signed)
   Date of Surgery: 03/01/2020  INDICATIONS: Kayla Mora is a 24 y.o.-year-old female who sustained a right ankle fracture; she was indicated for open reduction and internal fixation due to the displaced nature of the articular fracture and came to the operating room today for this procedure. The patient did consent to the procedure after discussion of the risks and benefits.  PREOPERATIVE DIAGNOSIS: right bimalleolar ankle fracture  POSTOPERATIVE DIAGNOSIS: Same.  PROCEDURE: Open treatment of right ankle fracture with internal fixation. Bimalleolar CPT [phone]  SURGEON: N. Glee Arvin, M.D.  ASSIST: None  ANESTHESIA:  general, regional  TOURNIQUET TIME: see anesthesia record  IV FLUIDS AND URINE: See anesthesia.  ESTIMATED BLOOD LOSS: minimal mL.  IMPLANTS: Katrinka Blazing and Nephew  COMPLICATIONS: see description of procedure.  DESCRIPTION OF PROCEDURE: The patient was brought to the operating room and placed supine on the operating table.  The patient had been signed prior to the procedure and this was documented. The patient had the anesthesia placed by the anesthesiologist.  A nonsterile tourniquet was placed on the upper thigh.  The prep verification and incision time-outs were performed to confirm that this was the correct patient, site, side and location. The patient had an SCD on the opposite lower extremity. The patient did receive antibiotics prior to the incision and was re-dosed during the procedure as needed at indicated intervals.  The patient had the lower extremity prepped and draped in the standard surgical fashion.  The extremity was exsanguinated using an esmarch bandage and the tourniquet was inflated to 300 mm Hg.  An incision was made over the distal fibula.  Full-thickness flaps were raised and subperiosteal elevation was performed.  The fracture lines were visualized and I found that this was not a typical ankle fracture due to the crush type of injury that she sustained.   The distal fibula was comminuted into several pieces in the sagittal and coronal planes.  This made fixation in the distal fragment more difficult.  Fortunately I was able to realign the fibula and this was brought out to length.  I placed a precontoured distal fibula plate using fluoroscopy.  Bicortical nonlocking screws were placed in the proximal segment each with excellent purchase.  Distally it was a challenge to get fixation all the fragments due to the comminution but I was able to get to locking screws in the anterior fragment.  I then made a separate incision over the medial malleolus.  Full-thickness flaps were raised.  Saphenous vein was mobilized anteriorly.  The fracture line was visualized and reduction was obtained.  2 parallel K wires were advanced across the fracture.  Cannulated screws were then placed over the K wire to gain compression and rotational stability of the fracture.  Stress exam of the ankle showed no widening of the medial clear space.  Surgical wounds were thoroughly irrigated and closed in layered fashion.  Sterile dressings were applied.  Short leg splint was placed.  Patient tolerated procedure well had no me complications.  POSTOPERATIVE PLAN: Kayla Mora will remain nonweightbearing on this leg for approximately 6 weeks; Kayla Mora will return for suture removal in 2 weeks.  He will be immobilized in a short leg splint and then transitioned to a CAM walker at his first follow up appointment.  Kayla Mora will receive DVT prophylaxis based on other medications, activity level, and risk ratio of bleeding to thrombosis.  Mayra Reel, MD [facility] 5:48 PM

## 2020-03-01 NOTE — Anesthesia Postprocedure Evaluation (Signed)
Anesthesia Post Note  Patient: Kayla Mora  Procedure(s) Performed: OPEN REDUCTION INTERNAL FIXATION (ORIF) RIGHT BIMALLEOLAR ANKLE FRACTURE (Right Ankle)     Patient location during evaluation: PACU Anesthesia Type: Regional and General Level of consciousness: awake and alert Pain management: pain level controlled Vital Signs Assessment: post-procedure vital signs reviewed and stable Respiratory status: spontaneous breathing, nonlabored ventilation and respiratory function stable Cardiovascular status: blood pressure returned to baseline and stable Postop Assessment: no apparent nausea or vomiting Anesthetic complications: no   No complications documented.  Last Vitals:  Vitals:   02/29/20 1515 02/29/20 1545  BP: 121/70 (!) 118/93  Pulse: 97 (!) 110  Resp: 11 18  Temp:  36.7 C  SpO2: 100% 100%    Last Pain:  Vitals:   02/29/20 1545  TempSrc:   PainSc: 0-No pain                 Ishaan Villamar,W. EDMOND

## 2020-03-07 ENCOUNTER — Telehealth: Payer: Self-pay

## 2020-03-07 NOTE — Telephone Encounter (Signed)
Surgery was last week?  Has she been taking this medicine since then and not had hives until last night?

## 2020-03-07 NOTE — Telephone Encounter (Signed)
Surgery was on 02/29/2020 and yes she has been taking medication since surgery and yes

## 2020-03-07 NOTE — Telephone Encounter (Signed)
Patient would like a Rx refill on Hydrocodone?  Stated that she broke out in hives and had skin discoloration last night and this morning when putting her right leg down to go to the restroom, but the hives go away when her right leg is leveled. Wanted to know if this is normal or should she be worried?  CB# [phone].  Please advise.  Thank you.

## 2020-03-08 ENCOUNTER — Other Ambulatory Visit: Payer: Self-pay | Admitting: Physician Assistant

## 2020-03-08 MED ORDER — HYDROCODONE-ACETAMINOPHEN 7.5-325 MG PO TABS
1.0000 | ORAL_TABLET | Freq: Three times a day (TID) | ORAL | 0 refills | Status: AC | PRN
Start: 2020-03-08 — End: ?

## 2020-03-08 NOTE — Telephone Encounter (Signed)
Sent in norco as hives are not likely from norco since they did not occur until a week after taking the medicine.  If she were to get hives again have her stop the medication

## 2020-03-08 NOTE — Telephone Encounter (Signed)
Talked with patient and advised her of message below per Lindsey. Patient voiced that she understands. 

## 2020-03-14 ENCOUNTER — Encounter: Payer: Self-pay | Admitting: Orthopaedic Surgery

## 2020-03-14 ENCOUNTER — Ambulatory Visit (INDEPENDENT_AMBULATORY_CARE_PROVIDER_SITE_OTHER): Payer: 59 | Admitting: Orthopaedic Surgery

## 2020-03-14 ENCOUNTER — Ambulatory Visit (INDEPENDENT_AMBULATORY_CARE_PROVIDER_SITE_OTHER): Payer: 59

## 2020-03-14 DIAGNOSIS — S82841A Displaced bimalleolar fracture of right lower leg, initial encounter for closed fracture: Secondary | ICD-10-CM | POA: Diagnosis not present

## 2020-03-14 MED ORDER — ASPIRIN EC 81 MG PO TBEC: 81.0000 mg | DELAYED_RELEASE_TABLET | Freq: Two times a day (BID) | ORAL | 0 refills | Status: AC

## 2020-03-14 NOTE — Progress Notes (Signed)
   Post-Op Visit Note   Patient: Kayla Mora           Date of Birth: Sep 05, 1995           MRN: [id number] Visit Date: 03/14/2020 PCP: Patient, No Pcp Per   Assessment & Plan:  Chief Complaint:  Chief Complaint  Patient presents with  . Right Ankle - Routine Post Op, Follow-up   Visit Diagnoses:  1. Bimalleolar ankle fracture, right, closed, initial encounter     Plan: Patient is a pleasant 24 year old female who comes in today 2 weeks out ORIF right bimalleolar ankle fracture.  She has been doing well.  She notes that she has not been elevating her leg over the past week.  He is taking Norco once daily as needed.  Examination of her left ankle reveals well-healing surgical incisions with nylon sutures in place.  The abrasions to the anterior tibia are healing well.  Calf is soft nontender.  She is neurovascular intact distally.  She does have moderate swelling to the right ankle.  At this point, sutures were removed and Steri-Strips applied.  She was placed in a cam walker nonweightbearing for another 4 weeks.  She may come out to apply ice.  I have reinforced the need for elevation of the right lower extremity.  She will follow up with [place] in 4 weeks time for repeat evaluation and 3 view x-rays of the right ankle.  Follow-Up Instructions: Return in about 4 weeks (around 04/11/2020).   Orders:  Orders Placed This Encounter  Procedures  . XR Ankle Complete Right   No orders of the defined types were placed in this encounter.   Imaging: XR Ankle Complete Right  Result Date: 03/14/2020 X-rays demonstrate stable alignment of the hardware and fracture without interval change   PMFS History: Patient Active Problem List   Diagnosis Date Noted  . Bimalleolar ankle fracture, right, closed, initial encounter 02/23/2020   Past Medical History:  Diagnosis Date  . Club foot     History reviewed. No pertinent family history.  Past Surgical History:  Procedure Laterality Date   . CLUB FOOT RELEASE    . ORIF ANKLE FRACTURE Right 02/29/2020   Procedure: OPEN REDUCTION INTERNAL FIXATION (ORIF) RIGHT BIMALLEOLAR ANKLE FRACTURE;  Surgeon: Tarry Kos, MD;  Location: [facility];  Service: Orthopedics;  Laterality: Right;   Social History   Occupational History  . Not on file  Tobacco Use  . Smoking status: Never Smoker  . Smokeless tobacco: Never Used  Substance and Sexual Activity  . Alcohol use: Yes    Comment: rare  . Drug use: Never  . Sexual activity: Not on file

## 2020-03-21 ENCOUNTER — Telehealth: Payer: Self-pay | Admitting: Orthopaedic Surgery

## 2020-03-21 NOTE — Telephone Encounter (Signed)
Please advise. OK to order wheelchair for patient?

## 2020-03-21 NOTE — Telephone Encounter (Signed)
Pt called and would like to know if she could get a wheelchair through her insurance? Please give her a phone call asap thank you at [phone]

## 2020-03-22 ENCOUNTER — Telehealth: Payer: Self-pay

## 2020-03-22 NOTE — Telephone Encounter (Signed)
noted 

## 2020-03-22 NOTE — Telephone Encounter (Signed)
Could you please put in Parachute for me? I am in [place] office with limited capability. Thank you.

## 2020-03-22 NOTE — Telephone Encounter (Signed)
Received this message on parachute   Good Morning! We have attempted to contact your patient for payment due today of $56.23 with subsequent payments of $6.22 monthly.  We were unable to reach the patient for payment.  Delivery will occur after payment can be collected.  Patient or family member can call [phone] or [phone] to provide [place] with information needed."

## 2020-03-22 NOTE — Telephone Encounter (Signed)
I called patient and gave phone numbers to contact.

## 2020-03-22 NOTE — Telephone Encounter (Signed)
Order is in. I know that there has been a wheelchair shortage issue with [employer] but Dr. Roda Shutters has signed order will have to wait and see.

## 2020-03-22 NOTE — Telephone Encounter (Signed)
Ok to do

## 2020-04-10 ENCOUNTER — Ambulatory Visit (INDEPENDENT_AMBULATORY_CARE_PROVIDER_SITE_OTHER): Payer: 59

## 2020-04-10 ENCOUNTER — Other Ambulatory Visit: Payer: Self-pay

## 2020-04-10 ENCOUNTER — Ambulatory Visit (INDEPENDENT_AMBULATORY_CARE_PROVIDER_SITE_OTHER): Payer: 59 | Admitting: Orthopaedic Surgery

## 2020-04-10 DIAGNOSIS — S82841A Displaced bimalleolar fracture of right lower leg, initial encounter for closed fracture: Secondary | ICD-10-CM

## 2020-04-10 NOTE — Progress Notes (Signed)
   Post-Op Visit Note   Patient: Kayla Mora           Date of Birth: 1996/03/13           MRN: [id number] Visit Date: 04/10/2020 PCP: Patient, No Pcp Per   Assessment & Plan:  Chief Complaint:  Chief Complaint  Patient presents with  . Right Ankle - Routine Post Op   Visit Diagnoses:  1. Bimalleolar ankle fracture, right, closed, initial encounter     Plan:   Kayla Mora is 6 weeks status post ORIF right bimalleolar ankle fracture.  She reports no pain.  Finished aspirin.  No complaints.  Surgical scars are fully healed.  Mild swelling.  Moderate decreased range of motion.  X-rays demonstrate significant healing of fractures.  At this point we will allow her to weight-bear as tolerated and I have made a referral to outpatient PT for ankle rehab.  No pain medications needed.  Recheck in 6 weeks with three-view x-rays of the right ankle.  Follow-Up Instructions: Return in about 6 weeks (around 05/22/2020).   Orders:  Orders Placed This Encounter  Procedures  . XR Ankle Complete Right  . Ambulatory referral to Physical Therapy   No orders of the defined types were placed in this encounter.   Imaging: XR Ankle Complete Right  Result Date: 04/10/2020 Stable fixation of bimalleolar ankle fracture.  Fractures are healing very well.  No complications with the hardware.   PMFS History: Patient Active Problem List   Diagnosis Date Noted  . Bimalleolar ankle fracture, right, closed, initial encounter 02/23/2020   Past Medical History:  Diagnosis Date  . Club foot     No family history on file.  Past Surgical History:  Procedure Laterality Date  . CLUB FOOT RELEASE    . ORIF ANKLE FRACTURE Right 02/29/2020   Procedure: OPEN REDUCTION INTERNAL FIXATION (ORIF) RIGHT BIMALLEOLAR ANKLE FRACTURE;  Surgeon: Tarry Kos, MD;  Location: [facility];  Service: Orthopedics;  Laterality: Right;   Social History   Occupational History  . Not on file  Tobacco Use   . Smoking status: Never Smoker  . Smokeless tobacco: Never Used  Substance and Sexual Activity  . Alcohol use: Yes    Comment: rare  . Drug use: Never  . Sexual activity: Not on file

## 2020-04-11 ENCOUNTER — Ambulatory Visit: Payer: 59 | Admitting: Orthopaedic Surgery

## 2020-04-26 ENCOUNTER — Ambulatory Visit: Payer: 59 | Attending: Orthopaedic Surgery

## 2020-04-26 ENCOUNTER — Other Ambulatory Visit: Payer: Self-pay

## 2020-04-26 DIAGNOSIS — R262 Difficulty in walking, not elsewhere classified: Secondary | ICD-10-CM

## 2020-04-26 DIAGNOSIS — M6281 Muscle weakness (generalized): Secondary | ICD-10-CM | POA: Diagnosis present

## 2020-04-26 DIAGNOSIS — M25671 Stiffness of right ankle, not elsewhere classified: Secondary | ICD-10-CM

## 2020-04-26 DIAGNOSIS — R2689 Other abnormalities of gait and mobility: Secondary | ICD-10-CM

## 2020-04-26 DIAGNOSIS — S82841A Displaced bimalleolar fracture of right lower leg, initial encounter for closed fracture: Secondary | ICD-10-CM | POA: Diagnosis not present

## 2020-04-27 NOTE — Therapy (Signed)
[facility] Outpatient Rehabilitation [facility] [address] [place], [place], [address] Phone: [phone]   Fax:  [phone]  Physical Therapy Evaluation  Patient Details  Name: Kayla Mora MRN: [id number] Date of Birth: May 19, 1995 Referring Provider (PT): Tarry Kos, MD   Encounter Date: 04/26/2020   PT End of Session - 04/27/20 2044    Visit Number 1    Number of Visits 17    Date for PT Re-Evaluation 06/29/20    Authorization Type [employer]    Progress Note Due on Visit 10    PT Start Time 1835    PT Stop Time 1935    PT Time Calculation (min) 60 min    Equipment Utilized During Treatment Other (comment)   Crutches   Activity Tolerance Patient tolerated treatment well    Behavior During Therapy [facility] for tasks assessed/performed           Past Medical History:  Diagnosis Date  . Club foot     Past Surgical History:  Procedure Laterality Date  . CLUB FOOT RELEASE    . ORIF ANKLE FRACTURE Right 02/29/2020   Procedure: OPEN REDUCTION INTERNAL FIXATION (ORIF) RIGHT BIMALLEOLAR ANKLE FRACTURE;  Surgeon: Tarry Kos, MD;  Location: [facility];  Service: Orthopedics;  Laterality: Right;    There were no vitals filed for this visit.    Subjective Assessment - 04/27/20 2035    Subjective Pt reports she fx her r ankle when a wall fell on her R leg. Pt reports she is is not currently having pain.    Limitations Walking;Standing;House hold activities    Diagnostic tests XR 04/10/20: Stable fixation of bimalleolar ankle fracture.  Fractures are healing very   well.  No complications with the hardware.    Patient Stated Goals Regain good use of my R ankle    Currently in Pain? No/denies    Multiple Pain Sites No              [facility] PT Assessment - 04/27/20 0001      Assessment   Medical Diagnosis Bimalleolar ankle fracture, right, closed, initial encounter    Referring Provider (PT) Tarry Kos, MD    Onset  Date/Surgical Date 02/29/20    Hand Dominance Right    Next MD Visit 05/22/20    Prior Therapy No      Precautions   Precautions None      Restrictions   Weight Bearing Restrictions No   WBAT     Balance Screen   Has the patient fallen in the past 6 months No    Has the patient had a decrease in activity level because of a fear of falling?  No    Is the patient reluctant to leave their home because of a fear of falling?  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      Cognition   Overall Cognitive Status Within Functional Limits for tasks assessed      Observation/Other Assessments   Skin Integrity Healed incisions  of the medial and lateral ankles    Focus on Therapeutic Outcomes (FOTO)  35%      Observation/Other Assessments-Edema    Edema Circumferential      Circumferential Edema   Circumferential - Right 28.5 mid heel and malleoli    Circumferential - Left  28.0      Sensation   Light Touch Appears Intact   tingling of the R heal     ROM / Strength   AROM / PROM / Strength AROM      AROM   AROM Assessment Site Ankle    Right/Left Ankle Right;Left    Right Ankle Dorsiflexion -25    Right Ankle Plantar Flexion 50    Right Ankle Inversion 25    Right Ankle Eversion 13    Left Ankle Dorsiflexion 20    Left Ankle Plantar Flexion 60    Left Ankle Inversion 36    Left Ankle Eversion 21      Strength   Overall Strength Comments R ankle strength was not tested      Transfers   Transfers Sit to Stand;Stand to Sit    Sit to Stand 6: Modified independent (Device/Increase time)   decreased wt bearing R LE     Ambulation/Gait    Ambulation/Gait Yes    Assistive device Crutches    Gait Pattern Step-through pattern   decreased wt bearing R ankle. Following gait training, pt was able to walk to c a modified heel to toe pattern c decreased DF.                     Objective measurements completed on examination: See above findings.               PT Education - 04/27/20 2042    Education Details Eval findings, POC, HEP, gait training c crutches    Person(s) Educated Patient    Methods Explanation;Demonstration;Tactile cues;Verbal cues;Handout    Comprehension Verbalized understanding;Returned demonstration;Verbal cues required;Tactile cues required;Need further instruction            PT Short Term Goals - 04/27/20 2114      PT SHORT TERM GOAL #1   Title Pt will be ind in an initial HEP    Baseline Started on eval    Status New    Target Date 05/18/20      PT SHORT TERM GOAL #2   Title Improve R ankle DF to -10d    Baseline -25d    Status New    Target Date 05/18/20             PT Long Term Goals - 04/27/20 2118      PT LONG TERM GOAL #1   Title Pt will be Ind in a final HEP to maintain or progress achieved level    Status New    Target Date 06/29/20      PT LONG TERM GOAL #2   Title Pt's R ankle AROM with increase to DF 10d, PF 55d, Inv 30d, Ev 20 for improved functional mobility and quality of gait.    Status New    Target Date 06/29/20      PT LONG TERM GOAL #3   Title Increase pt's R ankle strength to 4+/5 or greater for improved functional mobility and quality of gait    Status New    Target Date 06/29/20      PT LONG TERM GOAL #4   Title Pt's will return  to a more normalized gait pattern without the use of an assitive device. Pt will walk with a heel toe pattern with a minimal or less limp over the R ankle.    Status New    Target Date 06/29/20      PT LONG TERM GOAL #5   Status --                  Plan - 04/27/20 2050    Clinical Impression  Statement Pt presents to PT S/P OPEN REDUCTION INTERNAL FIXATION (ORIF) RIGHT BIMALLEOLAR ANKLE FRACTURE on 02/28/21. Pt has significant limitations in R ankle ROM and strength. Pt walks with the assistance of crutches with decreased wt bearing and DF of the R ankle. Pt is able to WBAT. Pt will benefit from PT 2w8 for ROM, strengthening, modalities, and manual techniques to optimize the functional use and gait quality of the R ankle.    Examination-Activity Limitations Stairs;Stand;Locomotion Level    Stability/Clinical Decision Making Stable/Uncomplicated    Clinical Decision Making Low    Rehab Potential Good    PT Frequency 2x / week    PT Duration 8 weeks    PT Treatment/Interventions ADLs/Self Care Home Management;Cryotherapy;Electrical Stimulation;Iontophoresis 4mg /ml Dexamethasone;Moist Heat;Ultrasound;Balance training;Therapeutic exercise;Therapeutic activities;Functional mobility training;Stair training;Gait training;Patient/family education;Manual techniques;Dry needling;Taping;Vasopneumatic Device    PT Next Visit Plan Assess response to HEP. Progress ther ex and initiate strengthening exs as indicated. COmplete RPOMand manual techniques to improve ROM.    PT Home Exercise Plan [id number]    Consulted and Agree with Plan of Care Patient           Patient will benefit from skilled therapeutic intervention in order to improve the following deficits and impairments:  Abnormal gait,Difficulty walking,Decreased range of motion,Decreased balance,Decreased strength,Increased edema,Decreased mobility,Impaired flexibility,Decreased coordination  Visit Diagnosis: Bimalleolar ankle fracture, right, closed, initial encounter - Plan: PT plan of care cert/re-cert  Decreased range of motion of right ankle - Plan: PT plan of care cert/re-cert  Muscle weakness (generalized) - Plan: PT plan of care cert/re-cert  Difficulty in walking, not elsewhere classified - Plan: PT plan of care  cert/re-cert  Other abnormalities of gait and mobility - Plan: PT plan of care cert/re-cert     Problem List Patient Active Problem List   Diagnosis Date Noted  . Bimalleolar ankle fracture, right, closed, initial encounter 02/23/2020    14/04/2019 MS, PT 04/27/20 10:11 PM   [facility] Outpatient Rehabilitation [facility] [address] [place], [place], [place] Phone: [phone]   Fax:  [phone]  Name: Kayla Mora MRN: Tamera Punt Date of Birth: [birth year]

## 2020-05-03 ENCOUNTER — Other Ambulatory Visit: Payer: Self-pay

## 2020-05-03 ENCOUNTER — Ambulatory Visit: Payer: 59

## 2020-05-03 DIAGNOSIS — M6281 Muscle weakness (generalized): Secondary | ICD-10-CM

## 2020-05-03 DIAGNOSIS — M25671 Stiffness of right ankle, not elsewhere classified: Secondary | ICD-10-CM

## 2020-05-03 DIAGNOSIS — R262 Difficulty in walking, not elsewhere classified: Secondary | ICD-10-CM

## 2020-05-03 DIAGNOSIS — R2689 Other abnormalities of gait and mobility: Secondary | ICD-10-CM

## 2020-05-03 DIAGNOSIS — S82841A Displaced bimalleolar fracture of right lower leg, initial encounter for closed fracture: Secondary | ICD-10-CM

## 2020-05-04 NOTE — Therapy (Signed)
[facility] Outpatient Rehabilitation [facility] [address] [place], [place], [address] Phone: [phone]   Fax:  [phone]  Physical Therapy Treatment  Patient Details  Name: Kayla Mora MRN: [id number] Date of Birth: [birth year] Referring Provider (PT): Tarry Kos, MD   Encounter Date: 05/03/2020   PT End of Session - 05/04/20 1300    Visit Number 2    Number of Visits 17    Date for PT Re-Evaluation 06/29/20    Authorization Type [employer]    Progress Note Due on Visit 10    PT Start Time 1832    PT Stop Time 1920    PT Time Calculation (min) 48 min    Equipment Utilized During Treatment Other (comment)   crutches   Activity Tolerance Patient tolerated treatment well    Behavior During Therapy [facility] for tasks assessed/performed           Past Medical History:  Diagnosis Date  . Club foot     Past Surgical History:  Procedure Laterality Date  . CLUB FOOT RELEASE    . ORIF ANKLE FRACTURE Right 02/29/2020   Procedure: OPEN REDUCTION INTERNAL FIXATION (ORIF) RIGHT BIMALLEOLAR ANKLE FRACTURE;  Surgeon: Tarry Kos, MD;  Location: [facility];  Service: Orthopedics;  Laterality: Right;    There were no vitals filed for this visit.   Subjective Assessment - 05/04/20 1504    Subjective Pt. reports no concerns. Pt states she has been fair with completing HEP    Diagnostic tests XR 04/10/20: Stable fixation of bimalleolar ankle fracture.  Fractures are healing very   well.  No complications with the hardware.    Patient Stated Goals Regain good use of my R ankle    Currently in Pain? Yes    Pain Score 1     Pain Location Ankle    Pain Orientation Right    Pain Descriptors / Indicators Sharp    Pain Type Acute pain    Pain Onset More than a month ago    Pain Frequency Occasional    Aggravating Factors  the pain is sharp but brief              [facility] PT Assessment - 05/04/20 0001      AROM   Right Ankle  Dorsiflexion -10                         [facility] Adult PT Treatment/Exercise - 05/04/20 0001      Ambulation/Gait   Gait Comments Gait training c one crutch L LE. Gait Eric Form is improving with increased R ankle DF      Exercises   Exercises Ankle      Ankle Exercises: Stretches   Soleus Stretch 2 reps;30 seconds    Gastroc Stretch 2 reps;30 seconds      Ankle Exercises: Aerobic   Nustep 5 mins; L4; Legs only      Ankle Exercises: Seated   ABC's --   1 set   Towel Crunch --   10 reps x 3   Towel Inversion/Eversion 2 reps   15 reps     Ankle Exercises: Supine   T-Band 4 way c red Tband 10x2                  PT Education - 05/04/20 1259    Education Details HEP Tband exs for R ankle    Person(s) Educated Patient  Methods Explanation;Demonstration;Tactile cues;Verbal cues;Handout    Comprehension Verbalized understanding;Returned demonstration;Verbal cues required;Tactile cues required            PT Short Term Goals - 04/27/20 2114      PT SHORT TERM GOAL #1   Title Pt will be ind in an initial HEP    Baseline Started on eval    Status New    Target Date 05/18/20      PT SHORT TERM GOAL #2   Title Improve R ankle DF to -10d    Baseline -25d    Status New    Target Date 05/18/20             PT Long Term Goals - 04/27/20 2118      PT LONG TERM GOAL #1   Title Pt will be Ind in a final HEP to maintain or progress achieved level    Status New    Target Date 06/29/20      PT LONG TERM GOAL #2   Title Pt's R ankle AROM with increase to DF 10d, PF 55d, Inv 30d, Ev 20 for improved functional mobility and quality of gait.    Status New    Target Date 06/29/20      PT LONG TERM GOAL #3   Title Increase pt's R ankle strength to 4+/5 or greater for improved functional mobility and quality of gait    Status New    Target Date 06/29/20      PT LONG TERM GOAL #4   Title Pt's will return to a more normalized gait pattern without the use  of an assitive device. Pt will walk with a heel toe pattern with a minimal or less limp over the R ankle.    Status New    Target Date 06/29/20      PT LONG TERM GOAL #5   Status --                 Plan - 05/04/20 1508    Clinical Impression Statement PT was completed for R ankle mobility, strengthneing, and gait training. Pt's is making good progress. Pt's gait has improved to assist c 1 crutch. R ankle AROM for DF has increased to -10. Pt completed and was provided theraband exs for strengthening of the R ankle. Pt's HEP was updated.    Examination-Activity Limitations Stairs;Stand;Locomotion Level    Stability/Clinical Decision Making Stable/Uncomplicated    Clinical Decision Making Low    Rehab Potential Good    PT Frequency 2x / week    PT Duration 8 weeks    PT Treatment/Interventions ADLs/Self Care Home Management;Cryotherapy;Electrical Stimulation;Iontophoresis 4mg /ml Dexamethasone;Moist Heat;Ultrasound;Balance training;Therapeutic exercise;Therapeutic activities;Functional mobility training;Stair training;Gait training;Patient/family education;Manual techniques;Dry needling;Taping;Vasopneumatic Device    PT Next Visit Plan Assess response to HEP. Progress ther ex and initiate strengthening exs as indicated. COmplete RPOMand manual techniques to improve ROM.    PT Home Exercise Plan [id number]    Consulted and Agree with Plan of Care Patient           Patient will benefit from skilled therapeutic intervention in order to improve the following deficits and impairments:  Abnormal gait,Difficulty walking,Decreased range of motion,Decreased balance,Decreased strength,Increased edema,Decreased mobility,Impaired flexibility,Decreased coordination  Visit Diagnosis: Bimalleolar ankle fracture, right, closed, initial encounter  Decreased range of motion of right ankle  Muscle weakness (generalized)  Difficulty in walking, not elsewhere classified  Other abnormalities of gait  and mobility     Problem List Patient Active Problem List  Diagnosis Date Noted  . Bimalleolar ankle fracture, right, closed, initial encounter 02/23/2020    Joellyn Rued MS, PT 05/04/20 3:18 PM  [facility] Outpatient Rehabilitation [facility] [address] [place], [place], [address] Phone: [phone]   Fax:  [phone]  Name: Kayla Mora MRN: [id number] Date of Birth: 11/11/1995

## 2020-05-08 ENCOUNTER — Ambulatory Visit: Payer: 59

## 2020-05-08 ENCOUNTER — Other Ambulatory Visit: Payer: Self-pay

## 2020-05-08 DIAGNOSIS — S82841A Displaced bimalleolar fracture of right lower leg, initial encounter for closed fracture: Secondary | ICD-10-CM | POA: Diagnosis not present

## 2020-05-08 DIAGNOSIS — M6281 Muscle weakness (generalized): Secondary | ICD-10-CM

## 2020-05-08 DIAGNOSIS — M25671 Stiffness of right ankle, not elsewhere classified: Secondary | ICD-10-CM

## 2020-05-08 DIAGNOSIS — R262 Difficulty in walking, not elsewhere classified: Secondary | ICD-10-CM

## 2020-05-08 DIAGNOSIS — R2689 Other abnormalities of gait and mobility: Secondary | ICD-10-CM

## 2020-05-08 NOTE — Therapy (Signed)
[facility] Outpatient Rehabilitation [facility] [address] [place], [place], [address] Phone: [phone]   Fax:  [phone]  Physical Therapy Treatment  Patient Details  Name: Kayla Mora MRN: [id number] Date of Birth: [birth year] Referring Provider (PT): Tarry Kos, MD   Encounter Date: 05/08/2020   PT End of Session - 05/08/20 1842    Visit Number 3    Number of Visits 17    Date for PT Re-Evaluation 06/29/20    Authorization Type [employer]    Progress Note Due on Visit 10    PT Start Time 1836    PT Stop Time 1916    PT Time Calculation (min) 40 min    Equipment Utilized During Treatment Other (comment)   1 crutch   Activity Tolerance Patient tolerated treatment well    Behavior During Therapy [facility] for tasks assessed/performed           Past Medical History:  Diagnosis Date  . Club foot     Past Surgical History:  Procedure Laterality Date  . CLUB FOOT RELEASE    . ORIF ANKLE FRACTURE Right 02/29/2020   Procedure: OPEN REDUCTION INTERNAL FIXATION (ORIF) RIGHT BIMALLEOLAR ANKLE FRACTURE;  Surgeon: Tarry Kos, MD;  Location: [facility];  Service: Orthopedics;  Laterality: Right;    There were no vitals filed for this visit.   Subjective Assessment - 05/08/20 1839    Subjective Pt reports minimal to no pain unless doing activities and in weightbearing for prolonged time periods. She verbalizes with HEP but states she may not being doing them as frequently as she should, but she does do them.    Limitations Walking;Standing;House hold activities    Diagnostic tests XR 04/10/20: Stable fixation of bimalleolar ankle fracture.  Fractures are healing very   well.  No complications with the hardware.    Patient Stated Goals Regain good use of my R ankle    Currently in Pain? Yes    Pain Score 1     Pain Location Ankle    Pain Orientation Right    Pain Descriptors / Indicators Sharp   when weightbearing for prolonged time  period   Pain Type Acute pain    Pain Onset More than a month ago              [facility] PT Assessment - 05/08/20 0001      Assessment   Medical Diagnosis Bimalleolar ankle fracture, right, closed, initial encounter    Referring Provider (PT) Tarry Kos, MD    Onset Date/Surgical Date 02/29/20                         [facility] Adult PT Treatment/Exercise - 05/08/20 0001      Ambulation/Gait   Ambulation/Gait Yes    Ambulation/Gait Assistance 6: Modified independent (Device/Increase time)    Ambulation Distance (Feet) 180 Feet    Assistive device L Axillary Crutch    Gait Pattern Step-through pattern    Gait Comments Cues for heel to toe gait pattern. Decreased stance time on RLE and increased R knee hyperextension noted in stance and during ambulation.      Exercises   Exercises Ankle      Manual Therapy   Manual Therapy Passive ROM;Soft tissue mobilization    Soft tissue mobilization Brief STM and use of roller/IASTM along gastroc-soleus    Passive ROM Passive ankle motion in all planes briefly and R ankle  DF stretch      Ankle Exercises: Supine   T-Band 4 way theraband with green x 20 each direction      Ankle Exercises: Seated   ABC's 1 rep    Towel Crunch 5 reps   5x crunching across towel to edge   Marble Pickup 1 cup with ~15 marbles using R foot from towel to cup      Ankle Exercises: Standing   SLS RLE 4 x 5-10 sec with stepping strategy to maintain balance    Heel Raises Both;20 reps    Toe Raise 20 reps;Other (comment)   leaning against wall with slight knee FL (cues to avoid knee hyperextension)   Other Standing Ankle Exercises Tilt board DF/PF x 15 each direction    Other Standing Ankle Exercises Squats to depth prior to R heel raise occurring due to lack of R ankle DF ROM.      Ankle Exercises: Stretches   Soleus Stretch 2 reps;30 seconds    Gastroc Stretch 2 reps;30 seconds      Ankle Exercises: Aerobic   Nustep L4 x 5 min LE only                   PT Education - 05/08/20 1933    Education Details Reviewed HEP and provided green theraband for ankle band exercises    Person(s) Educated Patient    Methods Explanation;Demonstration;Tactile cues;Verbal cues    Comprehension Verbalized understanding;Returned demonstration            PT Short Term Goals - 04/27/20 2114      PT SHORT TERM GOAL #1   Title Pt will be ind in an initial HEP    Baseline Started on eval    Status New    Target Date 05/18/20      PT SHORT TERM GOAL #2   Title Improve R ankle DF to -10d    Baseline -25d    Status New    Target Date 05/18/20             PT Long Term Goals - 04/27/20 2118      PT LONG TERM GOAL #1   Title Pt will be Ind in a final HEP to maintain or progress achieved level    Status New    Target Date 06/29/20      PT LONG TERM GOAL #2   Title Pt's R ankle AROM with increase to DF 10d, PF 55d, Inv 30d, Ev 20 for improved functional mobility and quality of gait.    Status New    Target Date 06/29/20      PT LONG TERM GOAL #3   Title Increase pt's R ankle strength to 4+/5 or greater for improved functional mobility and quality of gait    Status New    Target Date 06/29/20      PT LONG TERM GOAL #4   Title Pt's will return to a more normalized gait pattern without the use of an assitive device. Pt will walk with a heel toe pattern with a minimal or less limp over the R ankle.    Status New    Target Date 06/29/20      PT LONG TERM GOAL #5   Status --                 Plan - 05/08/20 1932    Clinical Impression Statement Patient tolerated treatment session well with no adverse effects or significant increased pain  though pt's pain did increase from arrival to end of session after performing several interventions in weightbearing. She was able to perform squats in limited range due to decreased R ankle DF mobility. She expresses that she was "yanking" on red theraband to increase resistance during  band interventions, so she was progressed to performing with green theraband this session. Progression of stretches and standing exercises with good tolerance. She will continue to benefit from skilled PT intervention to improve R ankle mobility and function.    Examination-Activity Limitations Stairs;Stand;Locomotion Level    Stability/Clinical Decision Making Stable/Uncomplicated    Rehab Potential Good    PT Frequency 2x / week    PT Duration 8 weeks    PT Treatment/Interventions ADLs/Self Care Home Management;Cryotherapy;Electrical Stimulation;Iontophoresis 4mg /ml Dexamethasone;Moist Heat;Ultrasound;Balance training;Therapeutic exercise;Therapeutic activities;Functional mobility training;Stair training;Gait training;Patient/family education;Manual techniques;Dry needling;Taping;Vasopneumatic Device    PT Next Visit Plan Assess response to HEP. Progress ther ex and continue strengthening exs as indicated. Complete PROM and manual techniques to improve ROM. R hip strengthening (hip ABD)    PT Home Exercise Plan [id number]    Consulted and Agree with Plan of Care Patient           Patient will benefit from skilled therapeutic intervention in order to improve the following deficits and impairments:  Abnormal gait,Difficulty walking,Decreased range of motion,Decreased balance,Decreased strength,Increased edema,Decreased mobility,Impaired flexibility,Decreased coordination  Visit Diagnosis: Bimalleolar ankle fracture, right, closed, initial encounter  Decreased range of motion of right ankle  Muscle weakness (generalized)  Difficulty in walking, not elsewhere classified  Other abnormalities of gait and mobility     Problem List Patient Active Problem List   Diagnosis Date Noted  . Bimalleolar ankle fracture, right, closed, initial encounter 02/23/2020     Rhea Bleacher, PT, DPT 05/09/20 12:07 AM   [facility] Outpatient Rehabilitation [facility] [address] [place], [place], [address] Phone: [phone]   Fax:  [phone]  Name: KOY SZOPINSKI MRN: [id number] Date of Birth: 01-May-1995

## 2020-05-09 ENCOUNTER — Encounter: Payer: Self-pay | Admitting: Orthopaedic Surgery

## 2020-05-10 ENCOUNTER — Other Ambulatory Visit: Payer: Self-pay

## 2020-05-10 ENCOUNTER — Ambulatory Visit: Payer: 59

## 2020-05-10 DIAGNOSIS — R262 Difficulty in walking, not elsewhere classified: Secondary | ICD-10-CM

## 2020-05-10 DIAGNOSIS — S82841A Displaced bimalleolar fracture of right lower leg, initial encounter for closed fracture: Secondary | ICD-10-CM

## 2020-05-10 DIAGNOSIS — M25671 Stiffness of right ankle, not elsewhere classified: Secondary | ICD-10-CM

## 2020-05-10 DIAGNOSIS — M6281 Muscle weakness (generalized): Secondary | ICD-10-CM

## 2020-05-10 DIAGNOSIS — R2689 Other abnormalities of gait and mobility: Secondary | ICD-10-CM

## 2020-05-10 NOTE — Therapy (Signed)
[facility] Outpatient Rehabilitation [facility] [address] [place], [place], [address] Phone: [phone]   Fax:  [phone]  Physical Therapy Treatment  Patient Details  Name: Kayla Mora MRN: [id number] Date of Birth: 08-08-1995 Referring Provider (PT): Tarry Kos, MD   Encounter Date: 05/10/2020   PT End of Session - 05/10/20 1935    Visit Number 4    Number of Visits 17    Date for PT Re-Evaluation 06/29/20    Authorization Type [employer]    Progress Note Due on Visit 10    PT Start Time 1833    PT Stop Time 1917    PT Time Calculation (min) 44 min    Equipment Utilized During Treatment Other (comment)   crutch   Activity Tolerance Patient tolerated treatment well    Behavior During Therapy [facility] for tasks assessed/performed           Past Medical History:  Diagnosis Date  . Club foot     Past Surgical History:  Procedure Laterality Date  . CLUB FOOT RELEASE    . ORIF ANKLE FRACTURE Right 02/29/2020   Procedure: OPEN REDUCTION INTERNAL FIXATION (ORIF) RIGHT BIMALLEOLAR ANKLE FRACTURE;  Surgeon: Tarry Kos, MD;  Location: [facility];  Service: Orthopedics;  Laterality: Right;    There were no vitals filed for this visit.   Subjective Assessment - 05/10/20 1939    Subjective Pt reports she has had a little mor pain with her R ankle and took some tylenol prior to coming to therapy.    Diagnostic tests XR 04/10/20: Stable fixation of bimalleolar ankle fracture.  Fractures are healing very   well.  No complications with the hardware.    Patient Stated Goals Regain good use of my R ankle    Currently in Pain? Yes    Pain Score 4     Pain Location Ankle    Pain Orientation Right    Pain Descriptors / Indicators Sharp;Aching    Pain Type Acute pain    Pain Onset More than a month ago    Pain Frequency Occasional                             [facility] Adult PT Treatment/Exercise - 05/10/20 0001       Ambulation/Gait   Gait Comments Gait training with SPC. Pt walked with a decreased step length in comparison to the crutch, but was able to walk with a step through pattern bilat. With decreased DF there is an increased ext moment at the knee.      Manual Therapy   Manual Therapy Passive ROM;Soft tissue mobilization    Soft tissue mobilization STM and use of roller/IASTM along gastroc-soleus    Passive ROM Passive R ankle DF stretch      Ankle Exercises: Stretches   Soleus Stretch 2 reps;30 seconds    Gastroc Stretch 2 reps;30 seconds      Ankle Exercises: Aerobic   Nustep L4 x 5 min LE only      Ankle Exercises: Standing   SLS RLE 4 x 5-10 sec with stepping strategy to maintain balance    Heel Raises Both;20 reps    Toe Raise 20 reps;Other (comment)   leaning against wall with slight knee FL (cues to avoid knee hyperextension)   Side Shuffle (Round Trip) STanding R ankle zig/zag    Other Standing Ankle Exercises Tilt board DF/PF and  laterally x 15 each direction    Other Standing Ankle Exercises Squats to depth prior to R heel raise occurring due to lack of R ankle DF ROM 15x      Ankle Exercises: Supine   T-Band 4 way theraband with green x 20 each direction                    PT Short Term Goals - 04/27/20 2114      PT SHORT TERM GOAL #1   Title Pt will be ind in an initial HEP    Baseline Started on eval    Status New    Target Date 05/18/20      PT SHORT TERM GOAL #2   Title Improve R ankle DF to -10d    Baseline -25d    Status New    Target Date 05/18/20             PT Long Term Goals - 04/27/20 2118      PT LONG TERM GOAL #1   Title Pt will be Ind in a final HEP to maintain or progress achieved level    Status New    Target Date 06/29/20      PT LONG TERM GOAL #2   Title Pt's R ankle AROM with increase to DF 10d, PF 55d, Inv 30d, Ev 20 for improved functional mobility and quality of gait.    Status New    Target Date 06/29/20      PT LONG  TERM GOAL #3   Title Increase pt's R ankle strength to 4+/5 or greater for improved functional mobility and quality of gait    Status New    Target Date 06/29/20      PT LONG TERM GOAL #4   Title Pt's will return to a more normalized gait pattern without the use of an assitive device. Pt will walk with a heel toe pattern with a minimal or less limp over the R ankle.    Status New    Target Date 06/29/20      PT LONG TERM GOAL #5   Status --                 Plan - 05/10/20 1937    Clinical Impression Statement Gait training was completed with a SPC. Pt demonstrated proper technique and is appropriate to use either the crutch or SPC for assist per her preference. AROM for the R ankle DF has improved to -6d. Pt was completed for R ankle strengthening and ROM in the open and closed chain. Pt is making good progress with ROM, strength and function.    Examination-Activity Limitations Stairs;Stand;Locomotion Level    Stability/Clinical Decision Making Stable/Uncomplicated    Clinical Decision Making Low    Rehab Potential Good    PT Frequency 2x / week    PT Duration 8 weeks    PT Treatment/Interventions ADLs/Self Care Home Management;Cryotherapy;Electrical Stimulation;Iontophoresis 4mg /ml Dexamethasone;Moist Heat;Ultrasound;Balance training;Therapeutic exercise;Therapeutic activities;Functional mobility training;Stair training;Gait training;Patient/family education;Manual techniques;Dry needling;Taping;Vasopneumatic Device    PT Next Visit Plan Progress ther ex and continue strengthening exs as indicated. Complete PROM and manual techniques to improve ROM. R hip strengthening (hip ABD)    PT Home Exercise Plan [id number]    Consulted and Agree with Plan of Care Patient           Patient will benefit from skilled therapeutic intervention in order to improve the following deficits and impairments:  Abnormal gait,Difficulty walking,Decreased range  of motion,Decreased balance,Decreased  strength,Increased edema,Decreased mobility,Impaired flexibility,Decreased coordination  Visit Diagnosis: Bimalleolar ankle fracture, right, closed, initial encounter  Decreased range of motion of right ankle  Muscle weakness (generalized)  Difficulty in walking, not elsewhere classified  Other abnormalities of gait and mobility     Problem List Patient Active Problem List   Diagnosis Date Noted  . Bimalleolar ankle fracture, right, closed, initial encounter 02/23/2020    Joellyn Rued MS, PT 05/10/20 7:51 PM   [facility] Outpatient Rehabilitation [facility] [address] [place], [place], [address] Phone: [phone]   Fax:  [phone]  Name: HOA BRIGGS MRN: [id number] Date of Birth: [birth year]

## 2020-05-15 ENCOUNTER — Ambulatory Visit: Payer: 59

## 2020-05-15 ENCOUNTER — Other Ambulatory Visit: Payer: Self-pay

## 2020-05-15 DIAGNOSIS — R2689 Other abnormalities of gait and mobility: Secondary | ICD-10-CM

## 2020-05-15 DIAGNOSIS — M6281 Muscle weakness (generalized): Secondary | ICD-10-CM

## 2020-05-15 DIAGNOSIS — R262 Difficulty in walking, not elsewhere classified: Secondary | ICD-10-CM

## 2020-05-15 DIAGNOSIS — M25671 Stiffness of right ankle, not elsewhere classified: Secondary | ICD-10-CM

## 2020-05-15 DIAGNOSIS — S82841A Displaced bimalleolar fracture of right lower leg, initial encounter for closed fracture: Secondary | ICD-10-CM

## 2020-05-15 NOTE — Therapy (Signed)
[facility] Outpatient Rehabilitation [facility] [address] [place], [place], [address] Phone: [phone]   Fax:  [phone]  Physical Therapy Treatment  Patient Details  Name: Kayla Mora MRN: [id number] Date of Birth: 09-04-1995 Referring Provider (PT): Tarry Kos, MD   Encounter Date: 05/15/2020   PT End of Session - 05/15/20 1843    Visit Number 5    Number of Visits 17    Date for PT Re-Evaluation 06/29/20    Authorization Type [employer]    Progress Note Due on Visit 10    PT Start Time Nida Boatman   pt arrived late   PT Stop Time 1915    PT Time Calculation (min) 40 min    Equipment Utilized During Treatment Other (comment)   crutch   Activity Tolerance Patient tolerated treatment well    Behavior During Therapy [facility] for tasks assessed/performed           Past Medical History:  Diagnosis Date  . Club foot     Past Surgical History:  Procedure Laterality Date  . CLUB FOOT RELEASE    . ORIF ANKLE FRACTURE Right 02/29/2020   Procedure: OPEN REDUCTION INTERNAL FIXATION (ORIF) RIGHT BIMALLEOLAR ANKLE FRACTURE;  Surgeon: Tarry Kos, MD;  Location: [facility];  Service: Orthopedics;  Laterality: Right;    There were no vitals filed for this visit.   Subjective Assessment - 05/15/20 1836    Subjective "I went out to the store Friday and Saturday, and I drove those days. I drove myself here today too. I was sore on Sunday from going out the two days before. I went to the store again yesterday, but it was a much shorter trip."    Limitations Walking;Standing;House hold activities    Diagnostic tests XR 04/10/20: Stable fixation of bimalleolar ankle fracture.  Fractures are healing very   well.  No complications with the hardware.    Patient Stated Goals Regain good use of my R ankle    Currently in Pain? Yes    Pain Score 2     Pain Location Ankle    Pain Orientation Right    Pain Descriptors / Indicators Aching;Sharp     Pain Type Acute pain    Pain Onset More than a month ago              [facility] PT Assessment - 05/15/20 0001      Assessment   Medical Diagnosis Bimalleolar ankle fracture, right, closed, initial encounter    Referring Provider (PT) Tarry Kos, MD    Onset Date/Surgical Date 02/29/20      AROM   Right Ankle Dorsiflexion -5                         [facility] Adult PT Treatment/Exercise - 05/15/20 0001      Ambulation/Gait   Ambulation/Gait Yes    Ambulation/Gait Assistance 6: Modified independent (Device/Increase time);7: Independent    Ambulation Distance (Feet) 40 Feet    Assistive device L Axillary Crutch    Gait Pattern Step-through pattern    Gait Comments Gait training with and without shoes and briefly with vs without L axillary crutch to allow pt demonstrate decreased heel strike and weightbearing through RLE      Manual Therapy   Manual Therapy Joint mobilization;Passive ROM    Joint Mobilization AP and PA grades I-IV mobilizations of talus on distal tib/fib    Passive ROM  Passive R ankle DF stretch      Ankle Exercises: Aerobic   Nustep L7 x 5 min LE only      Ankle Exercises: Stretches   Soleus Stretch 1 rep;Other (comment)   90 seconds   Gastroc Stretch 1 rep;Other (comment)   90 seconds     Ankle Exercises: Standing   Heel Raises Both;20 reps    Toe Raise 20 reps   2 x 20. leaning against wall with slight knee FL (cues to avoid knee hyperextension)   Other Standing Ankle Exercises Tilt board AP and ML x 15 each direction    Other Standing Ankle Exercises Squats to depth prior to R heel raise occurring due to lack of R ankle DF ROM 15x      Ankle Exercises: Supine   T-Band INV and EV x 20 each direction with green theraband                  PT Education - 05/15/20 1933    Education Details Reviewed HEP and advised pt to perform DF stretch with towel/pillowcase at home    Person(s) Educated Patient    Methods  Explanation;Demonstration;Verbal cues    Comprehension Verbalized understanding;Returned demonstration            PT Short Term Goals - 05/15/20 1938      PT SHORT TERM GOAL #1   Title Pt will be ind in an initial HEP    Baseline Verbalizes compliance with HEP    Status Achieved    Target Date 05/18/20      PT SHORT TERM GOAL #2   Title Improve R ankle DF to -10d    Baseline -5 degrees R ankle DF    Status Achieved    Target Date 05/18/20             PT Long Term Goals - 04/27/20 2118      PT LONG TERM GOAL #1   Title Pt will be Ind in a final HEP to maintain or progress achieved level    Status New    Target Date 06/29/20      PT LONG TERM GOAL #2   Title Pt's R ankle AROM with increase to DF 10d, PF 55d, Inv 30d, Ev 20 for improved functional mobility and quality of gait.    Status New    Target Date 06/29/20      PT LONG TERM GOAL #3   Title Increase pt's R ankle strength to 4+/5 or greater for improved functional mobility and quality of gait    Status New    Target Date 06/29/20      PT LONG TERM GOAL #4   Title Pt's will return to a more normalized gait pattern without the use of an assitive device. Pt will walk with a heel toe pattern with a minimal or less limp over the R ankle.    Status New    Target Date 06/29/20      PT LONG TERM GOAL #5   Status --                 Plan - 05/15/20 1835    Clinical Impression Statement Patient tolerated session well with complaints of some stiffness as a result of her being more after over the past several days but no significant increase in pain. Gait training briefly performed without shoes and with vs without crutches due to patient mentioning she has increased difficulty with heelstrike during  household ambulation when she does not have shoes and occasionally for very short distances (2-3 feet) without crutch to grab something quickly. Advised patient to continue performing ankle stretches followed by  strengthening exercises and continued practice with L axillary crutch. She should continue to benefit from skilled PT intervention to further progress ROM, strength, and functional mobility.    Examination-Activity Limitations Stairs;Stand;Locomotion Level    Stability/Clinical Decision Making Stable/Uncomplicated    Rehab Potential Good    PT Frequency 2x / week    PT Duration 8 weeks    PT Treatment/Interventions ADLs/Self Care Home Management;Cryotherapy;Electrical Stimulation;Iontophoresis 4mg /ml Dexamethasone;Moist Heat;Ultrasound;Balance training;Therapeutic exercise;Therapeutic activities;Functional mobility training;Stair training;Gait training;Patient/family education;Manual techniques;Dry needling;Taping;Vasopneumatic Device    PT Next Visit Plan Progress ther ex and continue strengthening exs as indicated. Complete PROM and manual techniques to improve ROM.    PT Home Exercise Plan [id number]    Consulted and Agree with Plan of Care Patient           Patient will benefit from skilled therapeutic intervention in order to improve the following deficits and impairments:  Abnormal gait,Difficulty walking,Decreased range of motion,Decreased balance,Decreased strength,Increased edema,Decreased mobility,Impaired flexibility,Decreased coordination  Visit Diagnosis: Bimalleolar ankle fracture, right, closed, initial encounter  Decreased range of motion of right ankle  Muscle weakness (generalized)  Difficulty in walking, not elsewhere classified  Other abnormalities of gait and mobility     Problem List Patient Active Problem List   Diagnosis Date Noted  . Bimalleolar ankle fracture, right, closed, initial encounter 02/23/2020      14/04/2019, PT, DPT 05/15/20 7:39 PM  [facility] Outpatient Rehabilitation [facility] [address] [place], [place], [place] Phone: [phone]   Fax:  [phone]  Name: Kayla Mora MRN: Tamera Punt Date of Birth:  [birth year]

## 2020-05-17 ENCOUNTER — Other Ambulatory Visit: Payer: Self-pay

## 2020-05-17 ENCOUNTER — Ambulatory Visit: Payer: 59

## 2020-05-17 DIAGNOSIS — S82841A Displaced bimalleolar fracture of right lower leg, initial encounter for closed fracture: Secondary | ICD-10-CM

## 2020-05-17 DIAGNOSIS — R262 Difficulty in walking, not elsewhere classified: Secondary | ICD-10-CM

## 2020-05-17 DIAGNOSIS — M25671 Stiffness of right ankle, not elsewhere classified: Secondary | ICD-10-CM

## 2020-05-17 DIAGNOSIS — R2689 Other abnormalities of gait and mobility: Secondary | ICD-10-CM

## 2020-05-17 DIAGNOSIS — M6281 Muscle weakness (generalized): Secondary | ICD-10-CM

## 2020-05-17 NOTE — Therapy (Signed)
[facility] Outpatient Rehabilitation [facility] [address] [place], [place], [address] Phone: [phone]   Fax:  [phone]  Physical Therapy Treatment  Patient Details  Name: JAQUELINE UBER MRN: [id number] Date of Birth: 07-19-1995 Referring Provider (PT): Tarry Kos, MD   Encounter Date: 05/17/2020   PT End of Session - 05/17/20 1838    Visit Number 6    Number of Visits 17    Date for PT Re-Evaluation 06/29/20    Authorization Type [employer]    Progress Note Due on Visit 10    PT Start Time 1838    PT Stop Time 1921    PT Time Calculation (min) 43 min    Equipment Utilized During Treatment Other (comment)   crutch   Activity Tolerance Patient tolerated treatment well    Behavior During Therapy [facility] for tasks assessed/performed           Past Medical History:  Diagnosis Date  . Club foot     Past Surgical History:  Procedure Laterality Date  . CLUB FOOT RELEASE    . ORIF ANKLE FRACTURE Right 02/29/2020   Procedure: OPEN REDUCTION INTERNAL FIXATION (ORIF) RIGHT BIMALLEOLAR ANKLE FRACTURE;  Surgeon: Tarry Kos, MD;  Location: [facility];  Service: Orthopedics;  Laterality: Right;    There were no vitals filed for this visit.       [facility] PT Assessment - 05/17/20 0001      AROM   Right Ankle Dorsiflexion -5                         [facility] Adult PT Treatment/Exercise - 05/17/20 0001      Ambulation/Gait   Assistive device L Axillary Crutch    Gait Pattern Step-through pattern      Exercises   Exercises Ankle      Manual Therapy   Manual Therapy Joint mobilization;Passive ROM    Joint Mobilization PA grades I-IV mobilizations of talus on distal tib/fib    Passive ROM R ankle DF contract/relax stretch      Ankle Exercises: Stretches   Soleus Stretch 1 rep;Other (comment)   90 seconds   Gastroc Stretch 1 rep;Other (comment)   60 seconds     Ankle Exercises: Aerobic   Stationary Bike L3; 6  mins      Ankle Exercises: Standing   Heel Raises Both;20 reps    Toe Raise 20 reps   2 x 20. leaning against wall with slight knee FL (cues to avoid knee hyperextension)   Side Shuffle (Round Trip) Standing R ankle zig/zag    Other Standing Ankle Exercises Squats to depth prior to R heel raise occurring due to lack of R ankle DF ROM 15x                    PT Short Term Goals - 05/15/20 1938      PT SHORT TERM GOAL #1   Title Pt will be ind in an initial HEP    Baseline Verbalizes compliance with HEP    Status Achieved    Target Date 05/18/20      PT SHORT TERM GOAL #2   Title Improve R ankle DF to -10d    Baseline -5 degrees R ankle DF    Status Achieved    Target Date 05/18/20             PT Long Term Goals - 04/27/20 2118  PT LONG TERM GOAL #1   Title Pt will be Ind in a final HEP to maintain or progress achieved level    Status New    Target Date 06/29/20      PT LONG TERM GOAL #2   Title Pt's R ankle AROM with increase to DF 10d, PF 55d, Inv 30d, Ev 20 for improved functional mobility and quality of gait.    Status New    Target Date 06/29/20      PT LONG TERM GOAL #3   Title Increase pt's R ankle strength to 4+/5 or greater for improved functional mobility and quality of gait    Status New    Target Date 06/29/20      PT LONG TERM GOAL #4   Title Pt's will return to a more normalized gait pattern without the use of an assitive device. Pt will walk with a heel toe pattern with a minimal or less limp over the R ankle.    Status New    Target Date 06/29/20      PT LONG TERM GOAL #5   Status --                 Plan - 05/17/20 1945    Clinical Impression Statement PT session focused primarily on DF ROM for the R ankle with some attention to strengthening. ROM efforts included pt and PT applied stretches and talocural PA mobs. Pt tolerated the session well. Post session the AROM for R DF = -5d, improved since the initiation of PT, but not  in the last week. FOTO score was re-assessed and improved from 35% ability to 50% ability.    Examination-Activity Limitations Stairs;Stand;Locomotion Level    Stability/Clinical Decision Making Stable/Uncomplicated    Clinical Decision Making Low    Rehab Potential Good    PT Frequency 2x / week    PT Duration 8 weeks    PT Treatment/Interventions ADLs/Self Care Home Management;Cryotherapy;Electrical Stimulation;Iontophoresis 4mg /ml Dexamethasone;Moist Heat;Ultrasound;Balance training;Therapeutic exercise;Therapeutic activities;Functional mobility training;Stair training;Gait training;Patient/family education;Manual techniques;Dry needling;Taping;Vasopneumatic Device    PT Next Visit Plan Progress ther ex and continue strengthening exs as indicated. Complete PROM and manual techniques to improve ROM.    PT Home Exercise Plan [id number]    Consulted and Agree with Plan of Care Patient           Patient will benefit from skilled therapeutic intervention in order to improve the following deficits and impairments:  Abnormal gait,Difficulty walking,Decreased range of motion,Decreased balance,Decreased strength,Increased edema,Decreased mobility,Impaired flexibility,Decreased coordination  Visit Diagnosis: Bimalleolar ankle fracture, right, closed, initial encounter  Decreased range of motion of right ankle  Muscle weakness (generalized)  Difficulty in walking, not elsewhere classified  Other abnormalities of gait and mobility     Problem List Patient Active Problem List   Diagnosis Date Noted  . Bimalleolar ankle fracture, right, closed, initial encounter 02/23/2020    14/04/2019 MS, PT 05/17/20 7:57 PM   [facility] Outpatient Rehabilitation [facility] [address] [place], [place], [place] Phone: [phone]   Fax:  [phone]  Name: KAROL SKARZYNSKI MRN: Tamera Punt Date of Birth: 10-31-1995

## 2020-05-22 ENCOUNTER — Ambulatory Visit: Payer: 59 | Admitting: Orthopaedic Surgery

## 2020-05-22 ENCOUNTER — Ambulatory Visit (INDEPENDENT_AMBULATORY_CARE_PROVIDER_SITE_OTHER): Payer: 59

## 2020-05-22 ENCOUNTER — Other Ambulatory Visit: Payer: Self-pay

## 2020-05-22 ENCOUNTER — Ambulatory Visit: Payer: 59 | Attending: Orthopaedic Surgery

## 2020-05-22 DIAGNOSIS — S82841A Displaced bimalleolar fracture of right lower leg, initial encounter for closed fracture: Secondary | ICD-10-CM | POA: Insufficient documentation

## 2020-05-22 DIAGNOSIS — M25671 Stiffness of right ankle, not elsewhere classified: Secondary | ICD-10-CM | POA: Diagnosis present

## 2020-05-22 DIAGNOSIS — M6281 Muscle weakness (generalized): Secondary | ICD-10-CM | POA: Insufficient documentation

## 2020-05-22 DIAGNOSIS — R2689 Other abnormalities of gait and mobility: Secondary | ICD-10-CM | POA: Insufficient documentation

## 2020-05-22 DIAGNOSIS — R262 Difficulty in walking, not elsewhere classified: Secondary | ICD-10-CM | POA: Diagnosis present

## 2020-05-22 NOTE — Progress Notes (Signed)
   Post-Op Visit Note   Patient: Kayla Mora           Date of Birth: 1995/08/24           MRN: [id number] Visit Date: 05/22/2020 PCP: Patient, No Pcp Per   Assessment & Plan:  Chief Complaint:  Chief Complaint  Patient presents with  . Right Ankle - Pain, Follow-up   Visit Diagnoses:  1. Bimalleolar ankle fracture, right, closed, initial encounter     Plan: Patient is a pleasant 25 year old female who comes in today nearly 12 weeks out ORIF right bimalleolar ankle fracture.  She has been doing well.  She has been in physical therapy.  She has some discomfort at times.  She did return back to work today where she noticed increased swelling to the right ankle.  She is still ambulating with the aid of a single crutch due to the inability to dorsiflex her foot to neutral position.  Examination of her right ankle reveals fully healed surgical scars without complication.  Does have moderate swelling throughout.  No tenderness to the medial or lateral ankle.  She is not quite able to get to neutral with dorsiflexion.  She has slight pain and limitation with external rotation.  She is neurovascular intact distally.  At this point, she is nearly healed.  She will need to continue with physical therapy to help with range of motion, gait training and stabilization.  She will follow up with [place] in 6 weeks time for recheck and 3 view x-rays of the right ankle.  Call with concerns or questions in the meantime.  Follow-Up Instructions: Return in about 6 weeks (around 07/03/2020).   Orders:  Orders Placed This Encounter  Procedures  . XR Ankle Complete Right   No orders of the defined types were placed in this encounter.   Imaging: No results found.  PMFS History: Patient Active Problem List   Diagnosis Date Noted  . Bimalleolar ankle fracture, right, closed, initial encounter 02/23/2020   Past Medical History:  Diagnosis Date  . Club foot     No family history on file.  Past Surgical  History:  Procedure Laterality Date  . CLUB FOOT RELEASE    . ORIF ANKLE FRACTURE Right 02/29/2020   Procedure: OPEN REDUCTION INTERNAL FIXATION (ORIF) RIGHT BIMALLEOLAR ANKLE FRACTURE;  Surgeon: Tarry Kos, MD;  Location: [facility];  Service: Orthopedics;  Laterality: Right;   Social History   Occupational History  . Not on file  Tobacco Use  . Smoking status: Never Smoker  . Smokeless tobacco: Never Used  Substance and Sexual Activity  . Alcohol use: Yes    Comment: rare  . Drug use: Never  . Sexual activity: Not on file

## 2020-05-22 NOTE — Therapy (Signed)
[facility] Outpatient Rehabilitation [facility] [address] [place], [place], [address] Phone: [phone]   Fax:  [phone]  Physical Therapy Treatment  Patient Details  Name: Kayla Mora MRN: [id number] Date of Birth: 1995/11/22 Referring Provider (PT): Tarry Kos, MD   Encounter Date: 05/22/2020   PT End of Session - 05/22/20 1844    Visit Number 7    Number of Visits 17    Date for PT Re-Evaluation 06/29/20    Authorization Type [employer]    Progress Note Due on Visit 10    PT Start Time 1838    PT Stop Time 1920    PT Time Calculation (min) 42 min    Equipment Utilized During Treatment Other (comment)   crutch   Activity Tolerance Patient tolerated treatment well    Behavior During Therapy [facility] for tasks assessed/performed           Past Medical History:  Diagnosis Date  . Club foot     Past Surgical History:  Procedure Laterality Date  . CLUB FOOT RELEASE    . ORIF ANKLE FRACTURE Right 02/29/2020   Procedure: OPEN REDUCTION INTERNAL FIXATION (ORIF) RIGHT BIMALLEOLAR ANKLE FRACTURE;  Surgeon: Tarry Kos, MD;  Location: [facility];  Service: Orthopedics;  Laterality: Right;    There were no vitals filed for this visit.   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    Diagnostic tests XR 04/10/20: Stable fixation of bimalleolar ankle fracture.  Fractures are healing very   well.  No complications with the hardware.    Patient Stated Goals Regain good use of my R ankle    Currently in Pain? Yes    Pain Score 4     Pain Location Ankle    Pain Orientation Right    Pain Descriptors / Indicators Aching    Pain Type Chronic pain    Pain Onset More than a month ago    Pain Frequency Intermittent              [facility] PT Assessment - 05/22/20 0001      AROM   Right Ankle  Dorsiflexion 0                         [facility] Adult PT Treatment/Exercise - 05/22/20 0001      Exercises   Exercises Ankle      Manual Therapy   Manual Therapy Joint mobilization;Passive ROM    Joint Mobilization PA and AP grades lll-IV mobilizations of talus on distal tib/fib amd grade ll-lll AP and PA mobs fib/tib    Passive ROM R ankle DF, PF, ind, and Ev stretches      Ankle Exercises: Stretches   Soleus Stretch 2 reps;60 seconds    Gastroc Stretch 2 reps;60 seconds      Ankle Exercises: Aerobic   Nustep L6 x 5 min LE only      Ankle Exercises: Standing   SLS RLE 4 x 5-10 sec with stepping strategy to maintain balance    Heel Raises Both;20 reps    Toe Raise 20 reps   2 x 20. leaning against wall with slight knee FL (cues to avoid knee hyperextension)   Side Shuffle (Round Trip) Standing R ankle zig/zag;  PT Education - 05/22/20 1945    Education Details Elevation of R foot at office and completion of ankle pumps hourly to address swelling    Person(s) Educated Patient    Methods Explanation;Demonstration    Comprehension Verbalized understanding;Returned demonstration            PT Short Term Goals - 05/15/20 1938      PT SHORT TERM GOAL #1   Title Pt will be ind in an initial HEP    Baseline Verbalizes compliance with HEP    Status Achieved    Target Date 05/18/20      PT SHORT TERM GOAL #2   Title Improve R ankle DF to -10d    Baseline -5 degrees R ankle DF    Status Achieved    Target Date 05/18/20             PT Long Term Goals - 04/27/20 2118      PT LONG TERM GOAL #1   Title Pt will be Ind in a final HEP to maintain or progress achieved level    Status New    Target Date 06/29/20      PT LONG TERM GOAL #2   Title Pt's R ankle AROM with increase to DF 10d, PF 55d, Inv 30d, Ev 20 for improved functional mobility and quality of gait.    Status New    Target Date 06/29/20      PT LONG TERM GOAL #3    Title Increase pt's R ankle strength to 4+/5 or greater for improved functional mobility and quality of gait    Status New    Target Date 06/29/20      PT LONG TERM GOAL #4   Title Pt's will return to a more normalized gait pattern without the use of an assitive device. Pt will walk with a heel toe pattern with a minimal or less limp over the R ankle.    Status New    Target Date 06/29/20      PT LONG TERM GOAL #5   Status --                 Plan - 05/22/20 1946    Clinical Impression Statement PT was completed to address R ankle DF ROM and single leg exs for balance and to promote gait s an AD. Pt's AROM for DF with an extended knee improved to 0d. Pt tolerated PT without adverse effects.    Examination-Activity Limitations Stairs;Stand;Locomotion Level    Stability/Clinical Decision Making Stable/Uncomplicated    Clinical Decision Making Low    Rehab Potential Good    PT Frequency 2x / week    PT Duration 8 weeks    PT Treatment/Interventions ADLs/Self Care Home Management;Cryotherapy;Electrical Stimulation;Iontophoresis 4mg /ml Dexamethasone;Moist Heat;Ultrasound;Balance training;Therapeutic exercise;Therapeutic activities;Functional mobility training;Stair training;Gait training;Patient/family education;Manual techniques;Dry needling;Taping;Vasopneumatic Device    PT Next Visit Plan Continue strengthening exs. Complete PROM and manual techniques to improve ROM. Gait training in ll bars.    PT Home Exercise Plan [id number]    Consulted and Agree with Plan of Care Patient           Patient will benefit from skilled therapeutic intervention in order to improve the following deficits and impairments:  Abnormal gait,Difficulty walking,Decreased range of motion,Decreased balance,Decreased strength,Increased edema,Decreased mobility,Impaired flexibility,Decreased coordination  Visit Diagnosis: Bimalleolar ankle fracture, right, closed, initial encounter  Decreased range of motion  of right ankle  Muscle weakness (generalized)  Difficulty in walking, not elsewhere classified  Other abnormalities of gait and mobility     Problem List Patient Active Problem List   Diagnosis Date Noted  . Bimalleolar ankle fracture, right, closed, initial encounter 02/23/2020    Joellyn Rued MS, PT 05/22/20 7:55 PM  [facility] Rehabilitation [facility] [address] [place], [place], [address] Phone: [phone]   Fax:  [phone]  Name: Kayla Mora MRN: [id number] Date of Birth: 01-04-1996

## 2020-05-24 ENCOUNTER — Ambulatory Visit: Payer: 59

## 2020-05-24 ENCOUNTER — Other Ambulatory Visit: Payer: Self-pay

## 2020-05-24 DIAGNOSIS — R2689 Other abnormalities of gait and mobility: Secondary | ICD-10-CM

## 2020-05-24 DIAGNOSIS — R262 Difficulty in walking, not elsewhere classified: Secondary | ICD-10-CM

## 2020-05-24 DIAGNOSIS — S82841A Displaced bimalleolar fracture of right lower leg, initial encounter for closed fracture: Secondary | ICD-10-CM | POA: Diagnosis not present

## 2020-05-24 DIAGNOSIS — M6281 Muscle weakness (generalized): Secondary | ICD-10-CM

## 2020-05-24 DIAGNOSIS — M25671 Stiffness of right ankle, not elsewhere classified: Secondary | ICD-10-CM

## 2020-05-24 NOTE — Therapy (Signed)
[facility] Outpatient Rehabilitation [facility] [address] [place], [place], [address] Phone: [phone]   Fax:  [phone]  Physical Therapy Treatment  Patient Details  Name: Kayla Mora MRN: [id number] Date of Birth: 03-Oct-1995 Referring Provider (PT): Tarry Kos, MD   Encounter Date: 05/24/2020   PT End of Session - 05/24/20 1834    Visit Number 8    Number of Visits 17    Date for PT Re-Evaluation 06/29/20    Authorization Type [employer]    Progress Note Due on Visit 10    PT Start Time Nida Boatman   pt arrived late   PT Stop Time 1915    PT Time Calculation (min) 40 min    Equipment Utilized During Treatment Other (comment)   crutch   Activity Tolerance Patient tolerated treatment well    Behavior During Therapy [facility] for tasks assessed/performed           Past Medical History:  Diagnosis Date  . Club foot     Past Surgical History:  Procedure Laterality Date  . CLUB FOOT RELEASE    . ORIF ANKLE FRACTURE Right 02/29/2020   Procedure: OPEN REDUCTION INTERNAL FIXATION (ORIF) RIGHT BIMALLEOLAR ANKLE FRACTURE;  Surgeon: Tarry Kos, MD;  Location: [facility];  Service: Orthopedics;  Laterality: Right;    There were no vitals filed for this visit.   Subjective Assessment - 05/24/20 1834    Subjective "I didn't go in the office today. I haven't done much today other than go to the kitchen for lunch at home."    Limitations Walking;Standing;House hold activities    Diagnostic tests XR 04/10/20: Stable fixation of bimalleolar ankle fracture.  Fractures are healing very   well.  No complications with the hardware.    Patient Stated Goals Regain good use of my R ankle    Currently in Pain? No/denies    Pain Score 0-No pain    Pain Onset More than a month ago              [facility] PT Assessment - 05/24/20 0001      Assessment   Medical Diagnosis Bimalleolar ankle fracture, right, closed, initial encounter    Referring  Provider (PT) Tarry Kos, MD    Onset Date/Surgical Date 02/29/20      AROM   Right Ankle Dorsiflexion -5                         [facility] Adult PT Treatment/Exercise - 05/24/20 0001      Exercises   Exercises Ankle      Manual Therapy   Manual Therapy Joint mobilization;Passive ROM    Joint Mobilization PA and AP grades lll-IV mobilizations of talus on distal tib/fib amd grade ll-lll AP and PA mobs fib/tib    Passive ROM R ankle DF, PF, INV, and EV PROM and stretches      Ankle Exercises: Stretches   Soleus Stretch 1 rep;Other (comment)   120 seconds   Gastroc Stretch 1 rep;Other (comment)   120 seconds     Ankle Exercises: Aerobic   Nustep L7 x 5 min LE only      Ankle Exercises: Standing   SLS RLE 10 x 1-5 sec with UE support at freemotion    Heel Raises Both;20 reps    Toe Raise 20 reps   2 x 20. leaning against wall with slight knee FL (cues to avoid knee  hyperextension)   Side Shuffle (Round Trip) Standing R ankle zig/zag x 20    Other Standing Ankle Exercises Forward lunge with RLE forward 10 x 3-5 sec holds; UE support at freemotion. R hip ABD 2 x 15 with UE support at freemotion PRN    Other Standing Ankle Exercises Squats to depth prior to R heel raise occurring due to lack of R ankle DF ROM 15x                  PT Education - 05/24/20 1925    Education Details Reiterated elevation of R foot at office and throughout the day with ankle pumps for reduction of swelling.    Person(s) Educated Patient    Methods Demonstration;Explanation;Verbal cues    Comprehension Verbalized understanding;Returned demonstration            PT Short Term Goals - 05/15/20 1938      PT SHORT TERM GOAL #1   Title Pt will be ind in an initial HEP    Baseline Verbalizes compliance with HEP    Status Achieved    Target Date 05/18/20      PT SHORT TERM GOAL #2   Title Improve R ankle DF to -10d    Baseline -5 degrees R ankle DF    Status Achieved    Target  Date 05/18/20             PT Long Term Goals - 04/27/20 2118      PT LONG TERM GOAL #1   Title Pt will be Ind in a final HEP to maintain or progress achieved level    Status New    Target Date 06/29/20      PT LONG TERM GOAL #2   Title Pt's R ankle AROM with increase to DF 10d, PF 55d, Inv 30d, Ev 20 for improved functional mobility and quality of gait.    Status New    Target Date 06/29/20      PT LONG TERM GOAL #3   Title Increase pt's R ankle strength to 4+/5 or greater for improved functional mobility and quality of gait    Status New    Target Date 06/29/20      PT LONG TERM GOAL #4   Title Pt's will return to a more normalized gait pattern without the use of an assitive device. Pt will walk with a heel toe pattern with a minimal or less limp over the R ankle.    Status New    Target Date 06/29/20      PT LONG TERM GOAL #5   Status --                 Plan - 05/24/20 1835    Clinical Impression Statement Patient tolerated PT session well with no adverse effects or increased pain. She had an itchy area near incision site with minimal bleeding after scratching for which antibiotic ointmet and a bandage was applied with no adverse effects noted but pt advised to monitor for any consistent itchiness, swelling, redness/discoloration, or warmth. She demonstrates -5 degrees R ankle DF when assessed today following manual techniques. Will plan to continue PT to progress R ankle mobility and functional strength.    Examination-Activity Limitations Stairs;Stand;Locomotion Level    Stability/Clinical Decision Making Stable/Uncomplicated    Rehab Potential Good    PT Frequency 2x / week    PT Duration 8 weeks    PT Treatment/Interventions ADLs/Self Care Home Management;Cryotherapy;Electrical Stimulation;Iontophoresis 4mg /ml  Dexamethasone;Moist Heat;Ultrasound;Balance training;Therapeutic exercise;Therapeutic activities;Functional mobility training;Stair training;Gait  training;Patient/family education;Manual techniques;Dry needling;Taping;Vasopneumatic Device    PT Next Visit Plan Continue strengthening exs. Complete PROM and manual techniques to improve ROM. Gait training in ll bars. Lunges    PT Home Exercise Plan [id number]    Consulted and Agree with Plan of Care Patient           Patient will benefit from skilled therapeutic intervention in order to improve the following deficits and impairments:  Abnormal gait,Difficulty walking,Decreased range of motion,Decreased balance,Decreased strength,Increased edema,Decreased mobility,Impaired flexibility,Decreased coordination  Visit Diagnosis: Bimalleolar ankle fracture, right, closed, initial encounter  Decreased range of motion of right ankle  Muscle weakness (generalized)  Difficulty in walking, not elsewhere classified  Other abnormalities of gait and mobility     Problem List Patient Active Problem List   Diagnosis Date Noted  . Bimalleolar ankle fracture, right, closed, initial encounter 02/23/2020     Rhea Bleacher, PT, DPT 05/24/20 7:31 PM  [facility] Outpatient Rehabilitation [facility] [address] [place], [place], [address] Phone: [phone]   Fax:  [phone]  Name: Kayla Mora MRN: [id number] Date of Birth: 1995/05/03

## 2020-05-29 ENCOUNTER — Ambulatory Visit: Payer: 59

## 2020-05-29 ENCOUNTER — Other Ambulatory Visit: Payer: Self-pay

## 2020-05-29 DIAGNOSIS — M25671 Stiffness of right ankle, not elsewhere classified: Secondary | ICD-10-CM

## 2020-05-29 DIAGNOSIS — S82841A Displaced bimalleolar fracture of right lower leg, initial encounter for closed fracture: Secondary | ICD-10-CM

## 2020-05-29 DIAGNOSIS — M6281 Muscle weakness (generalized): Secondary | ICD-10-CM

## 2020-05-29 DIAGNOSIS — R2689 Other abnormalities of gait and mobility: Secondary | ICD-10-CM

## 2020-05-29 DIAGNOSIS — R262 Difficulty in walking, not elsewhere classified: Secondary | ICD-10-CM

## 2020-05-29 NOTE — Therapy (Signed)
[facility] Outpatient Rehabilitation [facility] [address] [place], [place], [address] Phone: [phone]   Fax:  [phone]  Physical Therapy Treatment  Patient Details  Name: Kayla Mora MRN: [id number] Date of Birth: Nov 04, 1995 Referring Provider (PT): Tarry Kos, MD   Encounter Date: 05/29/2020   PT End of Session - 05/29/20 2217    Visit Number 9    Number of Visits 17    Date for PT Re-Evaluation 06/29/20    Authorization Type [employer]    Progress Note Due on Visit 10    PT Start Time 1837    PT Stop Time 1923    PT Time Calculation (min) 46 min    Activity Tolerance Patient tolerated treatment well    Behavior During Therapy [facility] for tasks assessed/performed           Past Medical History:  Diagnosis Date  . Club foot     Past Surgical History:  Procedure Laterality Date  . CLUB FOOT RELEASE    . ORIF ANKLE FRACTURE Right 02/29/2020   Procedure: OPEN REDUCTION INTERNAL FIXATION (ORIF) RIGHT BIMALLEOLAR ANKLE FRACTURE;  Surgeon: Tarry Kos, MD;  Location: [facility];  Service: Orthopedics;  Laterality: Right;    There were no vitals filed for this visit.   Subjective Assessment - 05/29/20 1843    Subjective Pt reports she has pain initially c standing and walkingafter sleeping or periods of rest, 2-3/10, and intermittent twinges of sharp pain. Overall, the stiffness is slowly getting better.    Limitations Walking;Standing;House hold activities    Diagnostic tests XR 04/10/20: Stable fixation of bimalleolar ankle fracture.  Fractures are healing very   well.  No complications with the hardware.    Patient Stated Goals Regain good use of my R ankle    Currently in Pain? No/denies    Pain Score 0-No pain    Pain Location Ankle    Pain Orientation Right    Pain Descriptors / Indicators Aching;Sharp    Pain Type Chronic pain    Pain Onset More than a month ago    Pain Frequency Intermittent                              [facility] Adult PT Treatment/Exercise - 05/29/20 0001      Ambulation/Gait   Gait Pattern Step-through pattern   heel to toe pattern c compensation at the knee with hyperextension   Stairs Yes    Stairs Assistance 7: Independent   Asc steps s HR assist; HR assist needed to dsc step due to ROM limitation     Exercises   Exercises Ankle      Manual Therapy   Manual Therapy Joint mobilization;Passive ROM    Joint Mobilization PA and AP grades lll-IV mobilizations of talus on distal tib/fib amd grade ll-lll AP and PA mobs fib/tib    Passive ROM R ankle DF, PF, INV, and EV PROM and stretches      Ankle Exercises: Stretches   Soleus Stretch 1 rep;Other (comment)   120 seconds   Gastroc Stretch 1 rep;Other (comment)   120 seconds   Other Stretch R DF stretch on chair seat; 1 minx2      Ankle Exercises: Aerobic   Stationary Bike L3; 5 mins      Ankle Exercises: Standing   Heel Raises Both;20 reps    Toe Raise 20 reps   2  x 20. leaning against wall with slight knee FL (cues to avoid knee hyperextension)   Side Shuffle (Round Trip) Standing R ankle zig/zag x 20    Other Standing Ankle Exercises Lat steps c 4" box 20x; Forward step offs, 2" box, 20x; forward step off, 6' box, 2x                    PT Short Term Goals - 05/15/20 1938      PT SHORT TERM GOAL #1   Title Pt will be ind in an initial HEP    Baseline Verbalizes compliance with HEP    Status Achieved    Target Date 05/18/20      PT SHORT TERM GOAL #2   Title Improve R ankle DF to -10d    Baseline -5 degrees R ankle DF    Status Achieved    Target Date 05/18/20             PT Long Term Goals - 04/27/20 2118      PT LONG TERM GOAL #1   Title Pt will be Ind in a final HEP to maintain or progress achieved level    Status New    Target Date 06/29/20      PT LONG TERM GOAL #2   Title Pt's R ankle AROM with increase to DF 10d, PF 55d, Inv 30d, Ev 20 for improved  functional mobility and quality of gait.    Status New    Target Date 06/29/20      PT LONG TERM GOAL #3   Title Increase pt's R ankle strength to 4+/5 or greater for improved functional mobility and quality of gait    Status New    Target Date 06/29/20      PT LONG TERM GOAL #4   Title Pt's will return to a more normalized gait pattern without the use of an assitive device. Pt will walk with a heel toe pattern with a minimal or less limp over the R ankle.    Status New    Target Date 06/29/20      PT LONG TERM GOAL #5   Status --                 Plan - 05/29/20 2228    Clinical Impression Statement PT was completed to increase L ankle DF ROM, L ankle strength, and functional use of the R ankle. Pt's ambulation and asc/dsc steps has improved. Pt is able to walk without a crutch, however R knee hyperextension occurs during stance phase of gait due to decreased R ankle DF.  Pt notes she has been walking s a crutch since the start of the last weekend.While pt's functional mobility is improving, DF is limited to less than 0d. With manual stretching, Pt reports tightness on the ant. aspect of her ankle as well as stretching of her achilles.    Examination-Activity Limitations Stairs;Stand;Locomotion Level    Stability/Clinical Decision Making Stable/Uncomplicated    Clinical Decision Making Low    Rehab Potential Good    PT Frequency 2x / week    PT Duration 8 weeks    PT Treatment/Interventions ADLs/Self Care Home Management;Cryotherapy;Electrical Stimulation;Iontophoresis 4mg /ml Dexamethasone;Moist Heat;Ultrasound;Balance training;Therapeutic exercise;Therapeutic activities;Functional mobility training;Stair training;Gait training;Patient/family education;Manual techniques;Dry needling;Taping;Vasopneumatic Device    PT Next Visit Plan Continue strengthening exs. Complete PROM and manual techniques to improve ROM. Lunges    PT Home Exercise Plan [email or web address]    Consulted and Agree with  Plan of Care Patient           Patient will benefit from skilled therapeutic intervention in order to improve the following deficits and impairments:  Abnormal gait,Difficulty walking,Decreased range of motion,Decreased balance,Decreased strength,Increased edema,Decreased mobility,Impaired flexibility,Decreased coordination  Visit Diagnosis: Bimalleolar ankle fracture, right, closed, initial encounter  Decreased range of motion of right ankle  Muscle weakness (generalized)  Difficulty in walking, not elsewhere classified  Other abnormalities of gait and mobility     Problem List Patient Active Problem List   Diagnosis Date Noted  . Bimalleolar ankle fracture, right, closed, initial encounter 02/23/2020    Joellyn Rued MS, PT 05/29/20 10:51 PM  [facility] Outpatient Rehabilitation [facility] [address] [place], [place], [address] Phone: [phone]   Fax:  [phone]  Name: Kayla Mora MRN: [id number] Date of Birth: February 07, 1996

## 2020-05-31 ENCOUNTER — Ambulatory Visit: Payer: 59

## 2020-05-31 ENCOUNTER — Other Ambulatory Visit: Payer: Self-pay

## 2020-05-31 DIAGNOSIS — R262 Difficulty in walking, not elsewhere classified: Secondary | ICD-10-CM

## 2020-05-31 DIAGNOSIS — S82841A Displaced bimalleolar fracture of right lower leg, initial encounter for closed fracture: Secondary | ICD-10-CM | POA: Diagnosis not present

## 2020-05-31 DIAGNOSIS — R2689 Other abnormalities of gait and mobility: Secondary | ICD-10-CM

## 2020-05-31 DIAGNOSIS — M6281 Muscle weakness (generalized): Secondary | ICD-10-CM

## 2020-05-31 DIAGNOSIS — M25671 Stiffness of right ankle, not elsewhere classified: Secondary | ICD-10-CM

## 2020-06-01 NOTE — Therapy (Signed)
[facility] [address] [place], [place], [address] Phone: [phone]   Fax:  [phone]  Physical Therapy Treatment/Progress Note  Patient Details  Name: Kayla Mora MRN: [id number] Date of Birth: Jan 05, 1996 Referring Provider (PT): Tarry Kos, MD  Progress Note Reporting Period 04/26/20 to 05/31/20  See note below for Objective Data and Assessment of Progress/Goals.       Encounter Date: 05/31/2020   PT End of Session - 05/31/20 1855    Visit Number 10    Number of Visits 17    Date for PT Re-Evaluation 06/29/20    Authorization Type [employer]    Progress Note Due on Visit 10    PT Start Time 1840    PT Stop Time 1925    PT Time Calculation (min) 45 min    Activity Tolerance Patient tolerated treatment well    Behavior During Therapy WFL for tasks assessed/performed           Past Medical History:  Diagnosis Date  . Club foot     Past Surgical History:  Procedure Laterality Date  . CLUB FOOT RELEASE    . ORIF ANKLE FRACTURE Right 02/29/2020   Procedure: OPEN REDUCTION INTERNAL FIXATION (ORIF) RIGHT BIMALLEOLAR ANKLE FRACTURE;  Surgeon: Tarry Kos, MD;  Location: [facility];  Service: Orthopedics;  Laterality: Right;    There were no vitals filed for this visit.   Subjective Assessment - 06/01/20 1016    Subjective Pt reports she is doing well. her r ankle is welling less at work.    Limitations Walking;Standing;House hold activities    Diagnostic tests XR 04/10/20: Stable fixation of bimalleolar ankle fracture.  Fractures are healing very   well.  No complications with the hardware.    Patient Stated Goals Regain good use of my R ankle    Currently in Pain? No/denies    Pain Score 0-No pain    Pain Location Ankle    Pain Orientation Right    Pain Descriptors / Indicators Aching;Sharp    Pain Type Chronic pain    Pain Onset More than a month ago    Pain Frequency  Intermittent              [facility] PT Assessment - 06/01/20 0001      AROM   Right Ankle Dorsiflexion 0   knee extended     Ambulation/Gait   Gait Pattern Step-through pattern;Right steppage   heel to toe pattern c compensation at the knee with hyperextension   Gait Comments Gait training for a heel toe pattern with decreased step length to minimize hyperextension at knee                         [facility] Adult PT Treatment/Exercise - 06/01/20 0001      Exercises   Exercises Ankle      Manual Therapy   Manual Therapy Joint mobilization;Passive ROM    Joint Mobilization PA and AP grades lll-IV mobilizations of talus on distal tib/fib amd grade ll-lll AP and PA mobs fib/tib    Passive ROM R ankle DF, PF, INV, and EV PROM and stretches      Ankle Exercises: Stretches   Soleus Stretch 1 rep;Other (comment)   120 seconds   Gastroc Stretch 1 rep;Other (comment)   120 seconds   Other Stretch R DF stretch on chair seat; 1 minx2      Ankle  Exercises: Aerobic   Nustep L6 x 5 min LE only      Ankle Exercises: Standing   Heel Raises Both;20 reps    Toe Raise 20 reps   2 x 20. leaning against wall with slight knee FL (cues to avoid knee hyperextension)   Side Shuffle (Round Trip) Standing R ankle zig/zag x 20      Ankle Exercises: Seated   BAPS Sitting;Level 1;15 reps    BAPS Limitations each direction                    PT Short Term Goals - 05/15/20 1938      PT SHORT TERM GOAL #1   Title Pt will be ind in an initial HEP    Baseline Verbalizes compliance with HEP    Status Achieved    Target Date 05/18/20      PT SHORT TERM GOAL #2   Title Improve R ankle DF to -10d    Baseline -5 degrees R ankle DF    Status Achieved    Target Date 05/18/20             PT Long Term Goals - 04/27/20 2118      PT LONG TERM GOAL #1   Title Pt will be Ind in a final HEP to maintain or progress achieved level    Status New    Target Date 06/29/20      PT LONG  TERM GOAL #2   Title Pt's R ankle AROM with increase to DF 10d, PF 55d, Inv 30d, Ev 20 for improved functional mobility and quality of gait.    Status New    Target Date 06/29/20      PT LONG TERM GOAL #3   Title Increase pt's R ankle strength to 4+/5 or greater for improved functional mobility and quality of gait    Status New    Target Date 06/29/20      PT LONG TERM GOAL #4   Title Pt's will return to a more normalized gait pattern without the use of an assitive device. Pt will walk with a heel toe pattern with a minimal or less limp over the R ankle.    Status New    Target Date 06/29/20      PT LONG TERM GOAL #5   Status --                 Plan - 06/01/20 4696    Clinical Impression Statement With AROM, pt was able to achieve 0d, passively the R ankle DF does not significantly increase. Pt experiences both pressure on the ant. ankle and tigthness of her achilles with DF. Gait training was completed for a shorter stride length, but still with a step through pattern. This is to decreased the hyperextenion forces at her knee. Pt's progress c R ankle ROM is very slow with marked tightness. At this time, PT in the clinic is going to be placed on hold for approx 1 month. This is to allow a time frame for the R ankle Fx to continue to remodel and for ROM gains over the element of time. Pt is to continue with her HEP. Will re-assess the status of the pts R ankle in 1 month and continue PT as indicated.    Examination-Activity Limitations Stairs;Stand;Locomotion Level    Stability/Clinical Decision Making Stable/Uncomplicated    Clinical Decision Making Low    Rehab Potential Good    PT Frequency  Monthy    PT Treatment/Interventions ADLs/Self Care Home Management;Cryotherapy;Electrical Stimulation;Iontophoresis 4mg /ml Dexamethasone;Moist Heat;Ultrasound;Balance training;Therapeutic exercise;Therapeutic activities;Functional mobility training;Stair training;Gait training;Patient/family  education;Manual techniques;Dry needling;Taping;Vasopneumatic Device    PT Home Exercise Plan [id number]    Consulted and Agree with Plan of Care Patient           Patient will benefit from skilled therapeutic intervention in order to improve the following deficits and impairments:  Abnormal gait,Difficulty walking,Decreased range of motion,Decreased balance,Decreased strength,Increased edema,Decreased mobility,Impaired flexibility,Decreased coordination  Visit Diagnosis: Bimalleolar ankle fracture, right, closed, initial encounter  Decreased range of motion of right ankle  Muscle weakness (generalized)  Difficulty in walking, not elsewhere classified  Other abnormalities of gait and mobility     Problem List Patient Active Problem List   Diagnosis Date Noted  . Bimalleolar ankle fracture, right, closed, initial encounter 02/23/2020    14/04/2019 MS, PT 06/01/20 10:35 AM  [facility] [address] [place], [place], [place] Phone: [phone]   Fax:  [phone]  Name: Kayla Mora MRN: Tamera Punt Date of Birth: [birth year]

## 2020-06-07 ENCOUNTER — Ambulatory Visit: Payer: 59

## 2020-06-27 ENCOUNTER — Other Ambulatory Visit: Payer: Self-pay

## 2020-06-27 ENCOUNTER — Ambulatory Visit: Payer: 59 | Attending: Orthopaedic Surgery

## 2020-06-27 DIAGNOSIS — S82841A Displaced bimalleolar fracture of right lower leg, initial encounter for closed fracture: Secondary | ICD-10-CM | POA: Diagnosis present

## 2020-06-27 DIAGNOSIS — R262 Difficulty in walking, not elsewhere classified: Secondary | ICD-10-CM | POA: Insufficient documentation

## 2020-06-27 DIAGNOSIS — R2689 Other abnormalities of gait and mobility: Secondary | ICD-10-CM | POA: Diagnosis present

## 2020-06-27 DIAGNOSIS — M25671 Stiffness of right ankle, not elsewhere classified: Secondary | ICD-10-CM | POA: Diagnosis present

## 2020-06-27 DIAGNOSIS — M6281 Muscle weakness (generalized): Secondary | ICD-10-CM | POA: Diagnosis present

## 2020-06-28 NOTE — Therapy (Signed)
[place], [place], [address] Phone: [phone]   Fax:  [phone]  Physical Therapy Treatment/Re-Cert  Patient Details  Name: Kayla Mora MRN: [id number] Date of Birth: [birth year] Referring Provider (PT): Leandrew Koyanagi, MD   Encounter Date: 06/27/2020   PT End of Session - 06/28/20 0708    Visit Number 11    Number of Visits 19    Date for PT Re-Evaluation 09/01/20    Authorization Type [employer]    PT Start Time 1842    PT Stop Time 1930    PT Time Calculation (min) 48 min    Activity Tolerance Patient tolerated treatment well    Behavior During Therapy [facility] for tasks assessed/performed           Past Medical History:  Diagnosis Date  . Club foot     Past Surgical History:  Procedure Laterality Date  . CLUB FOOT RELEASE    . ORIF ANKLE FRACTURE Right 02/29/2020   Procedure: OPEN REDUCTION INTERNAL FIXATION (ORIF) RIGHT BIMALLEOLAR ANKLE FRACTURE;  Surgeon: Leandrew Koyanagi, MD;  Location: [place];  Service: Orthopedics;  Laterality: Right;    There were no vitals filed for this visit.   Subjective Assessment - 06/28/20 0704    Subjective Pt reports she has completed stretching es daily, but not as much as she should have. Pt states the pain of her R ankle is nothing or low, She does experience post medial knee pain at times. Pt notes her activitiy level is increasing.    Diagnostic tests XR 04/10/20: Stable fixation of bimalleolar ankle fracture.  Fractures are healing very   well.  No complications with the hardware.    Patient Stated Goals Regain good use of my R ankle    Currently in Pain? No/denies    Pain Score 0-No pain    Pain Location Ankle    Pain Orientation Right              [facility] PT Assessment - 06/28/20 0001      AROM   Right Ankle Dorsiflexion 0   2d PROM   Right Ankle Inversion 30    Right Ankle Eversion 28      Ambulation/Gait   Gait  Pattern Step-through pattern;Right steppage   Heel to toe pattern c compensation at the knee with hyperextension. Very mild limp noted.                        [facility] Adult PT Treatment/Exercise - 06/28/20 0001      Exercises   Exercises Ankle      Manual Therapy   Manual Therapy Joint mobilization;Passive ROM    Joint Mobilization PA and AP grades lll-IV mobilizations of talocrural and  distal tib/fib amd grade ll-lll AP and PA    Passive ROM R ankle DF      Ankle Exercises: Stretches   Soleus Stretch 1 rep;60 seconds    Gastroc Stretch 1 rep;60 seconds    Other Stretch R DF stretch on chair seat; 1 rep, 1 min      Ankle Exercises: Aerobic   Stationary Bike L3; 5 mins      Ankle Exercises: Standing   Heel Raises Both;20 reps    Toe Raise 20 reps   2 x 20. leaning against wall with slight knee FL (cues to avoid knee hyperextension)   Side Shuffle (Round Trip) Standing R ankle  zig/zag x 20                    PT Short Term Goals - 05/15/20 1938      PT SHORT TERM GOAL #1   Title Pt will be ind in an initial HEP    Baseline Verbalizes compliance with HEP    Status Achieved    Target Date 05/18/20      PT SHORT TERM GOAL #2   Title Improve R ankle DF to -10d    Baseline -5 degrees R ankle DF    Status Achieved    Target Date 05/18/20             PT Long Term Goals - 06/28/20 0725      PT LONG TERM GOAL #1   Title Pt will be Ind in a final HEP to maintain or progress achieved level    Status On-going    Target Date 09/01/20      PT LONG TERM GOAL #2   Title Pt's R ankle AROM with increase to DF 10d, PF 55d, Inv 30d, Ev 20 for improved functional mobility and quality of gait. 06/27/20-DF 0, ev 28, inv 30    Status Partially Met      PT LONG TERM GOAL #3   Title Increase pt's R ankle strength to 4+/5 or greater for improved functional mobility and quality of gait    Status On-going    Target Date 09/01/20      PT LONG TERM GOAL #4   Title  Pt's will return to a more normalized gait pattern without the use of an assitive device. Pt will walk with a heel toe pattern with a minimal or less limp over the R ankle. 06/27/20-Heel to topatttern c a very mild limp c hyper extension compensation of the knee    Status On-going    Target Date 09/01/20                 Plan - 06/28/20 0710    Clinical Impression Statement Pt returns to PT after being off 1 month. The quality of the pt's gait is improved with a very mild limp detected. R ankle DF AROM is still limited to 0d. There is a hard endfeel with PROM for DF. To compensate for decreased DF, there is increased hyperextension at the knee and pt is occasionally experiencing brief post/med knee pain. Pt reports increased tolerance to daily activities. PT was completed for R ankle strengthening and primarily for ROM with mobilizations, and PT and pt directed stretches and PROM. Pt will benefit from continued PT 1w8 to increase ROM, strength, and balance to optimize function of the R ankle.    Examination-Activity Limitations Stairs;Stand;Locomotion Level    Stability/Clinical Decision Making Stable/Uncomplicated    Clinical Decision Making Low    Rehab Potential Good    PT Frequency 1x / week    PT Duration 8 weeks    PT Treatment/Interventions ADLs/Self Care Home Management;Cryotherapy;Electrical Stimulation;Iontophoresis 15m/ml Dexamethasone;Moist Heat;Ultrasound;Balance training;Therapeutic exercise;Therapeutic activities;Functional mobility training;Stair training;Gait training;Patient/family education;Manual techniques;Dry needling;Taping;Vasopneumatic Device    PT Next Visit Plan ROM per mobs and stretching, strengthening per OKC and CKC, balance, and functional mobility    PT Home Exercise Plan [id number]   Consulted and Agree with Plan of Care Patient           Patient will benefit from skilled therapeutic intervention in order to improve the following deficits and impairments:   Abnormal gait,Difficulty  walking,Decreased range of motion,Decreased balance,Decreased strength,Increased edema,Decreased mobility,Impaired flexibility,Decreased coordination  Visit Diagnosis: Bimalleolar ankle fracture, right, closed, initial encounter  Decreased range of motion of right ankle  Muscle weakness (generalized)  Difficulty in walking, not elsewhere classified  Other abnormalities of gait and mobility     Problem List Patient Active Problem List   Diagnosis Date Noted  . Bimalleolar ankle fracture, right, closed, initial encounter 02/23/2020    Gar Ponto MS, PT 06/28/20 7:32 AM  [place], [place], [address] Phone: [phone]   Fax:  [phone]  Name: Kayla Mora MRN: [id number] Date of Birth: 1996-03-22

## 2020-07-19 ENCOUNTER — Other Ambulatory Visit: Payer: Self-pay

## 2020-07-19 ENCOUNTER — Ambulatory Visit: Payer: 59

## 2020-07-19 DIAGNOSIS — R2689 Other abnormalities of gait and mobility: Secondary | ICD-10-CM

## 2020-07-19 DIAGNOSIS — M6281 Muscle weakness (generalized): Secondary | ICD-10-CM

## 2020-07-19 DIAGNOSIS — S82841A Displaced bimalleolar fracture of right lower leg, initial encounter for closed fracture: Secondary | ICD-10-CM | POA: Diagnosis not present

## 2020-07-19 DIAGNOSIS — M25671 Stiffness of right ankle, not elsewhere classified: Secondary | ICD-10-CM

## 2020-07-19 DIAGNOSIS — R262 Difficulty in walking, not elsewhere classified: Secondary | ICD-10-CM

## 2020-07-20 NOTE — Therapy (Signed)
[place], [place], [address] Phone: [phone]   Fax:  [phone]  Physical Therapy Treatment  Patient Details  Name: Kayla Mora MRN: [id number] Date of Birth: Jul 04, 1995 Referring Provider (PT): Leandrew Koyanagi, MD   Encounter Date: 07/19/2020   PT End of Session - 07/19/20 1836    Visit Number 12    Number of Visits 19    Date for PT Re-Evaluation 09/01/20    Authorization Type [employer]    Progress Note Due on Visit 10    PT Start Time 1835    PT Stop Time 1915    PT Time Calculation (min) 40 min    Activity Tolerance Patient tolerated treatment well    Behavior During Therapy [facility] for tasks assessed/performed           Past Medical History:  Diagnosis Date  . Club foot     Past Surgical History:  Procedure Laterality Date  . CLUB FOOT RELEASE    . ORIF ANKLE FRACTURE Right 02/29/2020   Procedure: OPEN REDUCTION INTERNAL FIXATION (ORIF) RIGHT BIMALLEOLAR ANKLE FRACTURE;  Surgeon: Leandrew Koyanagi, MD;  Location: [place];  Service: Orthopedics;  Laterality: Right;    There were no vitals filed for this visit.   Subjective Assessment - 07/20/20 1259    Subjective Pt presents to PT with continued reports of R ankle stiffness. She has been compliant with HEP and daily stretching with no adverse effect, although she notes frustration that symptoms remain similar. She is ready to begin PT treatment at this time.    Currently in Pain? No/denies    Pain Score 0-No pain                             [facility] Adult PT Treatment/Exercise - 07/20/20 0001      Manual Therapy   Manual Therapy Joint mobilization;Passive ROM    Soft tissue mobilization grade II AP mobs to R ankle for inc DF    Passive ROM R ankle DF      Ankle Exercises: Aerobic   Stationary Bike L3 x 5 min while taking subjective      Ankle Exercises: Standing   Heel Raises Both;20 reps     Other Standing Ankle Exercises treadmill walk for increasing DF    Other Standing Ankle Exercises Squats to depth prior to R heel raise occurring due to lack of R ankle DF ROM 15x      Ankle Exercises: Stretches   Slant Board Stretch 2 reps;30 seconds    Other Stretch standing calf stretch 2x30 sec R      Ankle Exercises: Supine   T-Band ankle 4-way with blue tband 3x10 R                    PT Short Term Goals - 05/15/20 1938      PT SHORT TERM GOAL #1   Title Pt will be ind in an initial HEP    Baseline Verbalizes compliance with HEP    Status Achieved    Target Date 05/18/20      PT SHORT TERM GOAL #2   Title Improve R ankle DF to -10d    Baseline -5 degrees R ankle DF    Status Achieved    Target Date 05/18/20             PT Long Term  Goals - 06/28/20 0725      PT LONG TERM GOAL #1   Title Pt will be Ind in a final HEP to maintain or progress achieved level    Status On-going    Target Date 09/01/20      PT LONG TERM GOAL #2   Title Pt's R ankle AROM with increase to DF 10d, PF 55d, Inv 30d, Ev 20 for improved functional mobility and quality of gait. 06/27/20-DF 0, ev 28, inv 30    Status Partially Met      PT LONG TERM GOAL #3   Title Increase pt's R ankle strength to 4+/5 or greater for improved functional mobility and quality of gait    Status On-going    Target Date 09/01/20      PT LONG TERM GOAL #4   Title Pt's will return to a more normalized gait pattern without the use of an assitive device. Pt will walk with a heel toe pattern with a minimal or less limp over the R ankle. 06/27/20-Heel to topatttern c a very mild limp c hyper extension compensation of the knee    Status On-going    Target Date 09/01/20                 Plan - 07/20/20 1251    Clinical Impression Statement Pt was able to complete prescribed exercises with no adverse effect. She continues to demonstrate decreased R ankle DF and general AROM, with continued R knee  hyperextension to compensate for lack of motion. Pt would benefit from continued PT working on functional movements to increase R ankle movement and functional ability.    Examination-Activity Limitations Stairs;Stand;Locomotion Level    Stability/Clinical Decision Making Stable/Uncomplicated    Rehab Potential Good    PT Frequency 1x / week    PT Duration 8 weeks    PT Treatment/Interventions ADLs/Self Care Home Management;Cryotherapy;Electrical Stimulation;Iontophoresis 10m/ml Dexamethasone;Moist Heat;Ultrasound;Balance training;Therapeutic exercise;Therapeutic activities;Functional mobility training;Stair training;Gait training;Patient/family education;Manual techniques;Dry needling;Taping;Vasopneumatic Device    PT Next Visit Plan ROM per mobs and stretching, strengthening per OKC and CKC, balance, and functional mobility    PT Home Exercise Plan [id number]   Consulted and Agree with Plan of Care Patient           Patient will benefit from skilled therapeutic intervention in order to improve the following deficits and impairments:  Abnormal gait,Difficulty walking,Decreased range of motion,Decreased balance,Decreased strength,Increased edema,Decreased mobility,Impaired flexibility,Decreased coordination  Visit Diagnosis: Bimalleolar ankle fracture, right, closed, initial encounter  Decreased range of motion of right ankle  Muscle weakness (generalized)  Difficulty in walking, not elsewhere classified  Other abnormalities of gait and mobility     Problem List Patient Active Problem List   Diagnosis Date Noted  . Bimalleolar ankle fracture, right, closed, initial encounter 02/23/2020    DWard Chatters PT, DPT 07/20/20 1:00 PM  [place] [address]: [phone]  Fax:  [phone] Name: Kayla RAHRIGMRN: [id number] of Birth: [birth year]

## 2020-07-23 ENCOUNTER — Telehealth: Payer: Self-pay | Admitting: Orthopaedic Surgery

## 2020-07-23 NOTE — Telephone Encounter (Signed)
Called pt left 1X vm to call and set 6 wk follow appt with Dr. Roda Shutters. Will try again later

## 2020-07-25 ENCOUNTER — Other Ambulatory Visit: Payer: Self-pay

## 2020-07-25 ENCOUNTER — Encounter: Payer: Self-pay | Admitting: Orthopaedic Surgery

## 2020-07-25 ENCOUNTER — Ambulatory Visit: Payer: Self-pay

## 2020-07-25 ENCOUNTER — Ambulatory Visit: Payer: 59 | Admitting: Orthopaedic Surgery

## 2020-07-25 DIAGNOSIS — S82841A Displaced bimalleolar fracture of right lower leg, initial encounter for closed fracture: Secondary | ICD-10-CM | POA: Diagnosis not present

## 2020-07-25 NOTE — Progress Notes (Signed)
   Office Visit Note   Patient: Kayla Mora           Date of Birth: 09/07/1995           MRN: [id number] Visit Date: 07/25/2020              Requested by: No referring provider defined for this encounter. PCP: Patient, No Pcp Per (Inactive)   Assessment & Plan: Visit Diagnoses:  1. Bimalleolar ankle fracture, right, closed, initial encounter     Plan: Impression is 11-month status post ORIF right bimalleolar ankle fracture.  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  I feel that this will continue to improve as she heals more from the surgery.  She is ambulating and walking well without any problems and overall she is not limited in any way.  She is agreeable to continuing to do home exercises to help improve ankle dorsiflexion.  From my standpoint we can see her back as needed.  Follow-Up Instructions: Return if symptoms worsen or fail to improve.   Orders:  Orders Placed This Encounter  Procedures  . XR Ankle Complete Right   No orders of the defined types were placed in this encounter.     Procedures: No procedures performed   Clinical Data: No additional findings.   Subjective: Chief Complaint  Patient presents with  . Right Ankle - Pain    Kayla Mora is approximately 5 months status post ORIF right bimalleolar ankle fracture.  She has done well overall.  Her dorsiflexion has not progressed with physical therapy.  She has some mild discomfort after prolonged standing and activity but strength and overall gait and ambulation have improved.  Not really taking any medications for pain.   Review of Systems   Objective: Vital Signs: There were no vitals taken for this visit.  Physical Exam  Ortho Exam Right ankle shows fully healed surgical scars.  She still has some persistent mild swelling.  The scars are nontender.  She is able to dorsiflex to neutral with the knee flexed and extended.  She has no pain with this  maneuver. Specialty Comments:  No specialty comments available.  Imaging: XR Ankle Complete Right  Result Date: 07/25/2020 Healed bimalleolar ankle fracture without any hardware complications.  Mild posttraumatic arthritis.    PMFS History: Patient Active Problem List   Diagnosis Date Noted  . Bimalleolar ankle fracture, right, closed, initial encounter 02/23/2020   Past Medical History:  Diagnosis Date  . Club foot     History reviewed. No pertinent family history.  Past Surgical History:  Procedure Laterality Date  . CLUB FOOT RELEASE    . ORIF ANKLE FRACTURE Right 02/29/2020   Procedure: OPEN REDUCTION INTERNAL FIXATION (ORIF) RIGHT BIMALLEOLAR ANKLE FRACTURE;  Surgeon: Tarry Kos, MD;  [facility];  Service: Orthopedics;  Laterality: Right;   Social History   Occupational History  . Not on file  Tobacco Use  . Smoking status: Never Smoker  . Smokeless tobacco: Never Used  Substance and Sexual Activity  . Alcohol use: Yes    Comment: rare  . Drug use: Never  . Sexual activity: Not on file

## 2020-07-26 ENCOUNTER — Ambulatory Visit: Payer: 59 | Admitting: Orthopaedic Surgery

## 2020-07-26 ENCOUNTER — Ambulatory Visit: Payer: 59 | Attending: Orthopaedic Surgery

## 2020-07-26 DIAGNOSIS — S82841A Displaced bimalleolar fracture of right lower leg, initial encounter for closed fracture: Secondary | ICD-10-CM | POA: Diagnosis present

## 2020-07-26 DIAGNOSIS — M6281 Muscle weakness (generalized): Secondary | ICD-10-CM | POA: Diagnosis present

## 2020-07-26 DIAGNOSIS — R262 Difficulty in walking, not elsewhere classified: Secondary | ICD-10-CM

## 2020-07-26 DIAGNOSIS — R2689 Other abnormalities of gait and mobility: Secondary | ICD-10-CM | POA: Diagnosis present

## 2020-07-26 DIAGNOSIS — M25671 Stiffness of right ankle, not elsewhere classified: Secondary | ICD-10-CM | POA: Diagnosis present

## 2020-07-27 NOTE — Therapy (Signed)
Kayla Mora, [place], [address] Phone: [phone]   Fax:  [phone]  Physical Therapy Treatment  Patient Details  Name: Kayla Mora MRN: [id number] Date of Birth: June 06, 1995 Referring Provider (PT): Leandrew Koyanagi, MD   Encounter Date: 07/26/2020   PT End of Session - 07/26/20 1836    Visit Number 13    Number of Visits 19    Date for PT Re-Evaluation 09/01/20    Authorization Type [employer]    Progress Note Due on Visit 10    PT Start Time 1834    PT Stop Time 1915    PT Time Calculation (min) 41 min    Activity Tolerance Patient tolerated treatment well    Behavior During Therapy [facility] for tasks assessed/performed           Past Medical History:  Diagnosis Date  . Club foot     Past Surgical History:  Procedure Laterality Date  . CLUB FOOT RELEASE    . ORIF ANKLE FRACTURE Right 02/29/2020   Procedure: OPEN REDUCTION INTERNAL FIXATION (ORIF) RIGHT BIMALLEOLAR ANKLE FRACTURE;  Surgeon: Leandrew Koyanagi, MD;  Location: [place];  Service: Orthopedics;  Laterality: Right;    There were no vitals filed for this visit.   Subjective Assessment - 07/27/20 1321    Subjective Pt presents to PT with continued reports of R ankle stiffness. She has been compliant with HEP and daily stretching with no adverse effect, although she notes frustration that symptoms remain similar. She is ready to begin PT treatment at this time.    Currently in Pain? No/denies    Pain Score 0-No pain                             [facility] Adult PT Treatment/Exercise - 07/27/20 0001      Exercises   Exercises Knee/Hip      Knee/Hip Exercises: Standing   Heel Raises Limitations heel-toe raises x 20    Wall Squat 2 sets;10 reps    Wall Squat Limitations with ball    Lunge Walking - Round Trips lunge onto 8in step with R LE forward x 15    Other Standing Knee Exercises R LE slide 2x10  - focus on increasing DF    Other Standing Knee Exercises eccentric heel tap R LE stance 3x10      Knee/Hip Exercises: Seated   Sit to Sand 3 sets;10 reps;without UE support   R foot back for increase work and ankle DF     Ankle Exercises: Aerobic   Stationary Bike L4 x 5 min while taking subjective      Ankle Exercises: Supine   T-Band ankle 4-way with blue tband 3x10 R                    PT Short Term Goals - 05/15/20 1938      PT SHORT TERM GOAL #1   Title Pt will be ind in an initial HEP    Baseline Verbalizes compliance with HEP    Status Achieved    Target Date 05/18/20      PT SHORT TERM GOAL #2   Title Improve R ankle DF to -10d    Baseline -5 degrees R ankle DF    Status Achieved    Target Date 05/18/20  PT Long Term Goals - 06/28/20 0725      PT LONG TERM GOAL #1   Title Pt will be Ind in a final HEP to maintain or progress achieved level    Status On-going    Target Date 09/01/20      PT LONG TERM GOAL #2   Title Pt's R ankle AROM with increase to DF 10d, PF 55d, Inv 30d, Ev 20 for improved functional mobility and quality of gait. 06/27/20-DF 0, ev 28, inv 30    Status Partially Met      PT LONG TERM GOAL #3   Title Increase pt's R ankle strength to 4+/5 or greater for improved functional mobility and quality of gait    Status On-going    Target Date 09/01/20      PT LONG TERM GOAL #4   Title Pt's will return to a more normalized gait pattern without the use of an assitive device. Pt will walk with a heel toe pattern with a minimal or less limp over the R ankle. 06/27/20-Heel to topatttern c a very mild limp c hyper extension compensation of the knee    Status On-going    Target Date 09/01/20                 Plan - 07/27/20 1320    Clinical Impression Statement Pt was able to complete all prescribed exercises with on adverse effect or increase in pain. She continues to have decreased R ankle DF but is showing improved  functional mobility during squat and lunge motions. Overall she is progressing well since initial injury and should continue to be seen per POC as prescribed.    Examination-Activity Limitations Stairs;Stand;Locomotion Level    Stability/Clinical Decision Making Stable/Uncomplicated    Rehab Potential Good    PT Frequency 1x / week    PT Duration 8 weeks    PT Treatment/Interventions ADLs/Self Care Home Management;Cryotherapy;Electrical Stimulation;Iontophoresis 79m/ml Dexamethasone;Moist Heat;Ultrasound;Balance training;Therapeutic exercise;Therapeutic activities;Functional mobility training;Stair training;Gait training;Patient/family education;Manual techniques;Dry needling;Taping;Vasopneumatic Device    PT Next Visit Plan ROM per mobs and stretching, strengthening per OKC and CKC, balance, and functional mobility    PT Home Exercise Plan [id number]   Consulted and Agree with Plan of Care Patient           Patient will benefit from skilled therapeutic intervention in order to improve the following deficits and impairments:  Abnormal gait,Difficulty walking,Decreased range of motion,Decreased balance,Decreased strength,Increased edema,Decreased mobility,Impaired flexibility,Decreased coordination  Visit Diagnosis: Bimalleolar ankle fracture, right, closed, initial encounter  Decreased range of motion of right ankle  Muscle weakness (generalized)  Difficulty in walking, not elsewhere classified  Other abnormalities of gait and mobility     Problem List Patient Active Problem List   Diagnosis Date Noted  . Bimalleolar ankle fracture, right, closed, initial encounter 02/23/2020    DWard Chatters PT, DPT 07/27/20 1:22 PM  [facility]G[place] [address]Phone: [phone]  Fax:  [phone] Name: Kayla Mora: [id number] of Birth: 1Apr 18, 1997

## 2020-08-02 ENCOUNTER — Other Ambulatory Visit: Payer: Self-pay

## 2020-08-02 ENCOUNTER — Ambulatory Visit: Payer: 59

## 2020-08-02 DIAGNOSIS — S82841A Displaced bimalleolar fracture of right lower leg, initial encounter for closed fracture: Secondary | ICD-10-CM | POA: Diagnosis not present

## 2020-08-02 DIAGNOSIS — R262 Difficulty in walking, not elsewhere classified: Secondary | ICD-10-CM

## 2020-08-02 DIAGNOSIS — M25671 Stiffness of right ankle, not elsewhere classified: Secondary | ICD-10-CM

## 2020-08-02 DIAGNOSIS — R2689 Other abnormalities of gait and mobility: Secondary | ICD-10-CM

## 2020-08-02 DIAGNOSIS — M6281 Muscle weakness (generalized): Secondary | ICD-10-CM

## 2020-08-02 NOTE — Therapy (Signed)
[place], [place], [address] Phone: [phone]   Fax:  [phone]  Physical Therapy Treatment  Patient Details  Name: Kayla Mora MRN: [id number] Date of Birth: 1995/05/21 Referring Provider (PT): Leandrew Koyanagi, MD   Encounter Date: 08/02/2020   PT End of Session - 08/02/20 1831    Visit Number 14    Number of Visits 19    Date for PT Re-Evaluation 09/01/20    Authorization Type [employer]    Progress Note Due on Visit 10    PT Start Time 1831    PT Stop Time 1914    PT Time Calculation (min) 43 min    Activity Tolerance Patient tolerated treatment well    Behavior During Therapy [facility] for tasks assessed/performed           Past Medical History:  Diagnosis Date  . Club foot     Past Surgical History:  Procedure Laterality Date  . CLUB FOOT RELEASE    . ORIF ANKLE FRACTURE Right 02/29/2020   Procedure: OPEN REDUCTION INTERNAL FIXATION (ORIF) RIGHT BIMALLEOLAR ANKLE FRACTURE;  Surgeon: Leandrew Koyanagi, MD;  Location: [place];  Service: Orthopedics;  Laterality: Right;    There were no vitals filed for this visit.   Subjective Assessment - 08/02/20 1833    Subjective Pt presents to PT with continued R ankle tightness. She has continued to be compliant with HEP and daily stretching with no adverse effect. Pt is ready to begin PT treatment at this time.    Currently in Pain? No/denies    Pain Score 0-No pain                             [facility] Adult PT Treatment/Exercise - 08/02/20 0001      Knee/Hip Exercises: Standing   Heel Raises Limitations heel-toe raises x 20    Forward Step Up Right;2 sets;10 reps;Step Height: 8"   w/ march   Functional Squat 10 reps    SLS on foam 3x30 sec R LE    SLS with Vectors tandem walk in // x 2 laps    Other Standing Knee Exercises R LE slide 2x10 - focus on increasing DF    Other Standing Knee Exercises  eccentric heel tap R LE stance 3x10 - 4in step; then x 10 step down form 8in step      Ankle Exercises: Supine   T-Band ankle 4-way with blue tband 3x10 R      Ankle Exercises: Aerobic   Stationary Bike L4 x 5 min while taking subjective                    PT Short Term Goals - 05/15/20 1938      PT SHORT TERM GOAL #1   Title Pt will be ind in an initial HEP    Baseline Verbalizes compliance with HEP    Status Achieved    Target Date 05/18/20      PT SHORT TERM GOAL #2   Title Improve R ankle DF to -10d    Baseline -5 degrees R ankle DF    Status Achieved    Target Date 05/18/20             PT Long Term Goals - 06/28/20 0725      PT LONG TERM GOAL #1   Title Pt will be Ind in  a final HEP to maintain or progress achieved level    Status On-going    Target Date 09/01/20      PT LONG TERM GOAL #2   Title Pt's R ankle AROM with increase to DF 10d, PF 55d, Inv 30d, Ev 20 for improved functional mobility and quality of gait. 06/27/20-DF 0, ev 28, inv 30    Status Partially Met      PT LONG TERM GOAL #3   Title Increase pt's R ankle strength to 4+/5 or greater for improved functional mobility and quality of gait    Status On-going    Target Date 09/01/20      PT LONG TERM GOAL #4   Title Pt's will return to a more normalized gait pattern without the use of an assitive device. Pt will walk with a heel toe pattern with a minimal or less limp over the R ankle. 06/27/20-Heel to topatttern c a very mild limp c hyper extension compensation of the knee    Status On-going    Target Date 09/01/20                 Plan - 08/02/20 1918    Clinical Impression Statement Pt was able to complete prescribed exercises with no adverse effect, with exception of slight R knee pain with squat. She showed improved R aknle DF with functional activity an demonstrated ability to perform higher difficulty exercises. Pt is progressing well with therapy, with PT checking goals at next  session in order to determine next steps in POC.    Examination-Activity Limitations Stairs;Stand;Locomotion Level    Stability/Clinical Decision Making Stable/Uncomplicated    Rehab Potential Good    PT Frequency 1x / week    PT Duration 8 weeks    PT Treatment/Interventions ADLs/Self Care Home Management;Cryotherapy;Electrical Stimulation;Iontophoresis 55m/ml Dexamethasone;Moist Heat;Ultrasound;Balance training;Therapeutic exercise;Therapeutic activities;Functional mobility training;Stair training;Gait training;Patient/family education;Manual techniques;Dry needling;Taping;Vasopneumatic Device    PT Next Visit Plan ROM per mobs and stretching, strengthening per OKC and CKC, balance, and functional mobility    PT Home Exercise Plan [id number]   Consulted and Agree with Plan of Care Patient           Patient will benefit from skilled therapeutic intervention in order to improve the following deficits and impairments:  Abnormal gait,Difficulty walking,Decreased range of motion,Decreased balance,Decreased strength,Increased edema,Decreased mobility,Impaired flexibility,Decreased coordination  Visit Diagnosis: Bimalleolar ankle fracture, right, closed, initial encounter  Decreased range of motion of right ankle  Muscle weakness (generalized)  Difficulty in walking, not elsewhere classified  Other abnormalities of gait and mobility     Problem List Patient Active Problem List   Diagnosis Date Noted  . Bimalleolar ankle fracture, right, closed, initial encounter 02/23/2020    DWard Chatters PT, DPT 08/02/20 7:20 PM  [facility] [address]Phone: [phone]  Fax:  [phone] Name: Kayla OZIMEKMRN: [id number] of Birth: 1January 09, 1997

## 2020-08-09 ENCOUNTER — Ambulatory Visit: Payer: 59

## 2020-08-09 ENCOUNTER — Other Ambulatory Visit: Payer: Self-pay

## 2020-08-09 DIAGNOSIS — S82841A Displaced bimalleolar fracture of right lower leg, initial encounter for closed fracture: Secondary | ICD-10-CM

## 2020-08-09 DIAGNOSIS — R2689 Other abnormalities of gait and mobility: Secondary | ICD-10-CM

## 2020-08-09 DIAGNOSIS — R262 Difficulty in walking, not elsewhere classified: Secondary | ICD-10-CM

## 2020-08-09 DIAGNOSIS — M6281 Muscle weakness (generalized): Secondary | ICD-10-CM

## 2020-08-09 DIAGNOSIS — M25671 Stiffness of right ankle, not elsewhere classified: Secondary | ICD-10-CM

## 2020-08-10 NOTE — Therapy (Signed)
[place], [place], [address] Phone: [phone]   Fax:  [phone]  Physical Therapy Treatment  Patient Details  Name: Kayla Mora MRN: [id number] Date of Birth: [birth year] Referring Provider (PT): Leandrew Koyanagi, MD   Encounter Date: 08/09/2020   PT End of Session - 08/09/20 [place]    Visit Number 15    Number of Visits 19    Date for PT Re-Evaluation 09/13/20    Authorization Type [employer]    Progress Note Due on Visit 10    PT Start Time 1828    PT Stop Time 1906    PT Time Calculation (min) 38 min    Activity Tolerance Patient tolerated treatment well    Behavior During Therapy [facility] for tasks assessed/performed           Past Medical History:  Diagnosis Date  . Club foot     Past Surgical History:  Procedure Laterality Date  . CLUB FOOT RELEASE    . ORIF ANKLE FRACTURE Right 02/29/2020   Procedure: OPEN REDUCTION INTERNAL FIXATION (ORIF) RIGHT BIMALLEOLAR ANKLE FRACTURE;  Surgeon: Leandrew Koyanagi, MD;  Location: [place];  Service: Orthopedics;  Laterality: Right;    There were no vitals filed for this visit.   Subjective Assessment - 08/09/20 1829    Subjective Pt presents to PT with reports of continued R ankle tightness. She has been compliant with her HEP with no adverse effect. Pt does note some R knee soreness over the past week. She is ready to begin PT treatment at this time.    Currently in Pain? No/denies    Pain Score 0-No pain    Pain Descriptors / Indicators Tightness              [facility] PT Assessment - 08/10/20 0001      AROM   Right Ankle Dorsiflexion 6   past neutral     Strength   Right Ankle Dorsiflexion 4+/5    Right Ankle Plantar Flexion 5/5    Right Ankle Inversion 4+/5    Right Ankle Eversion 4+/5                         [facility] Adult PT Treatment/Exercise - 08/10/20 0001      Knee/Hip Exercises: Stretches    Gastroc Stretch 30 seconds;Right    Soleus Stretch 30 seconds;Right      Knee/Hip Exercises: Standing   Heel Raises Limitations heel-toe raises x 20    Functional Squat 2 sets;10 reps    Other Standing Knee Exercises R LE slide 2x10 - focus on increasing DF    Other Standing Knee Exercises eccentric heel tap R LE stance 3x10 - 4in step; then x 10 step down form 8in step      Ankle Exercises: Supine   T-Band ankle 4-way with blue tband 3x10 R      Ankle Exercises: Stretches   Gastroc Stretch 2 reps;30 seconds   with strap                   PT Short Term Goals - 05/15/20 1938      PT SHORT TERM GOAL #1   Title Pt will be ind in an initial HEP    Baseline Verbalizes compliance with HEP    Status Achieved    Target Date 05/18/20      PT SHORT TERM GOAL #2  Title Improve R ankle DF to -10d    Baseline -5 degrees R ankle DF    Status Achieved    Target Date 05/18/20             PT Long Term Goals - 08/10/20 1006      PT LONG TERM GOAL #1   Title Pt will be Ind in a final HEP to maintain or progress achieved level    Status On-going    Target Date 09/13/20      PT LONG TERM GOAL #2   Title Pt's R ankle AROM with increase to DF 10d, PF 55d, Inv 30d, Ev 20 for improved functional mobility and quality of gait. 06/27/20-DF 0, ev 28, inv 30    Baseline see flowsheet    Status Partially Met    Target Date 09/13/20      PT LONG TERM GOAL #3   Title Increase pt's R ankle strength to 4+/5 or greater for improved functional mobility and quality of gait    Baseline see flowsheet    Status Achieved      PT LONG TERM GOAL #4   Title Pt's will return to a more normalized gait pattern without the use of an assitive device. Pt will walk with a heel toe pattern with a minimal or less limp over the R ankle. 06/27/20-Heel to topatttern c a very mild limp c hyper extension compensation of the knee    Status On-going    Target Date 09/13/20                 Plan -  08/10/20 1003    Clinical Impression Statement Pt was able to complete prescribed exercises with no adverse effect. She continues to demo decreased R ankle DF that limits her gait and some functional movemetns, such as squatting. She has improved greatly with PT since start, as she is now able to DF past neutral, but has R knee hyperextension during gait as compensation. Pt will be traveling for the next month with HEP updated. PT will schedule additional visit after she returns to assess progress and readiness for discharge.    Examination-Activity Limitations Stairs;Stand;Locomotion Level    Stability/Clinical Decision Making Stable/Uncomplicated    Rehab Potential Good    PT Frequency 1x / week    PT Duration 8 weeks    PT Treatment/Interventions ADLs/Self Care Home Management;Cryotherapy;Electrical Stimulation;Iontophoresis 24m/ml Dexamethasone;Moist Heat;Ultrasound;Balance training;Therapeutic exercise;Therapeutic activities;Functional mobility training;Stair training;Gait training;Patient/family education;Manual techniques;Dry needling;Taping;Vasopneumatic Device    PT Next Visit Plan assess response to travel, measure R ankle ROM    PT Home Exercise Plan [id number]   Consulted and Agree with Plan of Care Patient           Patient will benefit from skilled therapeutic intervention in order to improve the following deficits and impairments:  Abnormal gait,Difficulty walking,Decreased range of motion,Decreased balance,Decreased strength,Increased edema,Decreased mobility,Impaired flexibility,Decreased coordination  Visit Diagnosis: Bimalleolar ankle fracture, right, closed, initial encounter - Plan: PT plan of care cert/re-cert  Decreased range of motion of right ankle - Plan: PT plan of care cert/re-cert  Muscle weakness (generalized) - Plan: PT plan of care cert/re-cert  Difficulty in walking, not elsewhere classified - Plan: PT plan of care cert/re-cert  Other abnormalities of gait  and mobility - Plan: PT plan of care cert/re-cert     Problem List Patient Active Problem List   Diagnosis Date Noted  . Bimalleolar ankle fracture, right, closed, initial encounter 02/23/2020    DShanon Brow  Paulette Blanch, PT, DPT 08/10/20 10:09 AM  [facility] [address] [place], [place], [address] Phone: [phone]   Fax:  [phone]  Name: Kayla Mora MRN: [id number] Date of Birth: 14-Feb-1996

## 2020-09-13 ENCOUNTER — Other Ambulatory Visit: Payer: Self-pay

## 2020-09-13 ENCOUNTER — Ambulatory Visit: Payer: 59 | Attending: Orthopaedic Surgery

## 2020-09-13 DIAGNOSIS — M25671 Stiffness of right ankle, not elsewhere classified: Secondary | ICD-10-CM | POA: Insufficient documentation

## 2020-09-13 DIAGNOSIS — S82841A Displaced bimalleolar fracture of right lower leg, initial encounter for closed fracture: Secondary | ICD-10-CM | POA: Diagnosis present

## 2020-09-13 DIAGNOSIS — M6281 Muscle weakness (generalized): Secondary | ICD-10-CM | POA: Insufficient documentation

## 2020-09-13 DIAGNOSIS — R2689 Other abnormalities of gait and mobility: Secondary | ICD-10-CM | POA: Insufficient documentation

## 2020-09-13 DIAGNOSIS — R262 Difficulty in walking, not elsewhere classified: Secondary | ICD-10-CM | POA: Insufficient documentation

## 2020-09-16 NOTE — Therapy (Addendum)
[place], [place], [address] Phone: [phone]   Fax:  [phone]  Physical Therapy Treatment/Discharge  Patient Details  Name: Kayla Mora MRN: [id number] Date of Birth: 07-03-1995 Referring Provider (PT): Leandrew Koyanagi, MD   Encounter Date: 09/13/2020   PT End of Session - 09/16/20 2017     Visit Number 16    Number of Visits 19    Date for PT Re-Evaluation 09/13/20    Authorization Type [employer]    Activity Tolerance Patient tolerated treatment well    Behavior During Therapy [facility] for tasks assessed/performed             Past Medical History:  Diagnosis Date   Club foot     Past Surgical History:  Procedure Laterality Date   CLUB FOOT RELEASE     ORIF ANKLE FRACTURE Right 02/29/2020   Procedure: OPEN REDUCTION INTERNAL FIXATION (ORIF) RIGHT BIMALLEOLAR ANKLE FRACTURE;  Surgeon: Leandrew Koyanagi, MD;  Location: [place];  Service: Orthopedics;  Laterality: Right;    There were no vitals filed for this visit.   Subjective Assessment - 09/16/20 2017     Subjective Pt presents to PT with reports of R foot pain and discomfort. She did a lot of walking while she was traveling to [place] and [place], with increased pain on uneven terrain. She was compliant with her HEP and reports no issues with this. Pt is ready to begin PT at this time.                [facility] PT Assessment - 09/16/20 0001       AROM   Right Ankle Dorsiflexion 7                           [facility] Adult PT Treatment/Exercise - 09/16/20 0001       Ankle Exercises: Stretches   Soleus Stretch 1 rep;60 seconds    Gastroc Stretch 2 reps;60 seconds    Slant Board Stretch 2 reps;60 seconds                      PT Short Term Goals - 05/15/20 1938       PT SHORT TERM GOAL #1   Title Pt will be ind in an initial HEP    Baseline Verbalizes compliance with HEP     Status Achieved    Target Date 05/18/20      PT SHORT TERM GOAL #2   Title Improve R ankle DF to -10d    Baseline -5 degrees R ankle DF    Status Achieved    Target Date 05/18/20               PT Long Term Goals - 08/10/20 1006       PT LONG TERM GOAL #1   Title Pt will be Ind in a final HEP to maintain or progress achieved level    Status On-going    Target Date 09/13/20      PT LONG TERM GOAL #2   Title Pt's R ankle AROM with increase to DF 10d, PF 55d, Inv 30d, Ev 20 for improved functional mobility and quality of gait. 06/27/20-DF 0, ev 28, inv 30    Baseline see flowsheet    Status Partially Met    Target Date 09/13/20      PT LONG TERM GOAL #3  Title Increase pt's R ankle strength to 4+/5 or greater for improved functional mobility and quality of gait    Baseline see flowsheet    Status Achieved      PT LONG TERM GOAL #4   Title Pt's will return to a more normalized gait pattern without the use of an assitive device. Pt will walk with a heel toe pattern with a minimal or less limp over the R ankle. 06/27/20-Heel to topatttern c a very mild limp c hyper extension compensation of the knee    Status On-going    Target Date 09/13/20                   Plan - 09/16/20 2012     Clinical Impression Statement Pt was able to complete prescribed exercises with no adverse effect and demonstrated knowledge of HEP. She continues to demo decreased R ankle DF that limits her gait and some functional movementss, such as squatting, but her gait did slightly improve with R heel wedge, showing increase in knee flexion. She has improved greatly with PT since start, as she is now able to DF past neutral to 7 degrees and should continue to improve with HEP compliance and healing time since injury. Pt feels confident with HEP and no longer requires skilled PT services at this time.    PT Treatment/Interventions ADLs/Self Care Home Management;Cryotherapy;Electrical  Stimulation;Iontophoresis 95m/ml Dexamethasone;Moist Heat;Ultrasound;Balance training;Therapeutic exercise;Therapeutic activities;Functional mobility training;Stair training;Gait training;Patient/family education;Manual techniques;Dry needling;Taping;Vasopneumatic Device    PT Home Exercise Plan [id number]   Consulted and Agree with Plan of Care Patient             Patient will benefit from skilled therapeutic intervention in order to improve the following deficits and impairments:  Abnormal gait, Difficulty walking, Decreased range of motion, Decreased balance, Decreased strength, Increased edema, Decreased mobility, Impaired flexibility, Decreased coordination  Visit Diagnosis: Bimalleolar ankle fracture, right, closed, initial encounter  Decreased range of motion of right ankle  Muscle weakness (generalized)  Difficulty in walking, not elsewhere classified  Other abnormalities of gait and mobility     Problem List Patient Active Problem List   Diagnosis Date Noted   Bimalleolar ankle fracture, right, closed, initial encounter 02/23/2020    DWard Chatters PT, DPT 09/16/20 8:18 PM  [facility] [address]Phone: [phone]  Fax:  [phone] Name: Kayla RASKMRN: [id number] of Birth: 107-06-1995 PHYSICAL THERAPY DISCHARGE SUMMARY  Visits from Start of Care: 16  Current functional level related to goals / functional outcomes: See objective and goals   Remaining deficits: See objective and goals   Education / Equipment: HEP   Patient agrees to discharge. Patient goals were partially met. Patient is being discharged due to being pleased with the current functional level.

## 2020-10-16 ENCOUNTER — Encounter: Payer: Self-pay | Admitting: Orthopaedic Surgery

## 2020-10-18 ENCOUNTER — Other Ambulatory Visit: Payer: Self-pay

## 2020-10-18 DIAGNOSIS — S82841A Displaced bimalleolar fracture of right lower leg, initial encounter for closed fracture: Secondary | ICD-10-CM

## 2020-10-22 NOTE — Telephone Encounter (Signed)
Order has been changed

## 2020-10-31 ENCOUNTER — Ambulatory Visit: Payer: 59 | Admitting: Physical Therapy

## 2020-10-31 ENCOUNTER — Other Ambulatory Visit: Payer: Self-pay

## 2020-10-31 NOTE — Therapy (Deleted)
Pt arrived and not seen d/t miscommunication about referral.

## 2020-11-07 ENCOUNTER — Ambulatory Visit: Payer: 59 | Admitting: Physical Therapy

## 2020-11-14 ENCOUNTER — Encounter: Payer: 59 | Admitting: Physical Therapy

## 2020-11-21 ENCOUNTER — Encounter: Payer: 59 | Admitting: Physical Therapy

## 2021-05-09 ENCOUNTER — Ambulatory Visit: Payer: Self-pay

## 2021-05-09 ENCOUNTER — Other Ambulatory Visit: Payer: Self-pay

## 2021-05-09 ENCOUNTER — Ambulatory Visit: Payer: 59 | Admitting: Orthopaedic Surgery

## 2021-05-09 ENCOUNTER — Encounter: Payer: Self-pay | Admitting: Orthopaedic Surgery

## 2021-05-09 DIAGNOSIS — S82841A Displaced bimalleolar fracture of right lower leg, initial encounter for closed fracture: Secondary | ICD-10-CM | POA: Diagnosis not present

## 2021-05-09 NOTE — Progress Notes (Signed)
Office Visit Note   Patient: Kayla Mora           Date of Birth: 09-26-1995           MRN: [id number] Visit Date: 05/09/2021              Requested by: No referring provider defined for this encounter. PCP: Patient, No Pcp Per (Inactive)   Assessment & Plan: Visit Diagnoses:  1. Bimalleolar ankle fracture, right, closed, initial encounter     Plan: Kayla Mora is a 26 year old female who underwent ORIF right bimalleolar ankle fracture in December 2021.  She comes in today for evaluation of ankle range of motion.  She is complaining that because of the lack of dorsiflexion past neutral she has trouble running or skiing or going to the trampoline park.  She is not having trouble with daily activities.  She is not reporting any significant pain.  Examination of the right ankle shows mild swelling diffusely.  She has no reproducible pain in the ankle joint.  She feels tightness and discomfort along the Achilles and gastroc.  Subtalar motion is normal.  Plantarflexion range of motion is normal.  Dorsiflexion to neutral.  Plantigrade foot.  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  We discussed that a gastrocnemius recession would improve her dorsiflexion but it may leave her with some residual weakness which may be counterproductive to some of the activities that she wants to do.  Overall she is not really having trouble with daily activities because of the heel cord contracture mainly just with the aforementioned activities but unfortunately I feel that a gastroc recession cannot reliably or predictably help her do those activities.  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  I think at this point the best thing to do is to continue with stretches and conservative management.  She is in agreement with this plan.  We will see her back as needed.  Follow-Up Instructions: No follow-ups on file.    Orders:  Orders Placed This Encounter  Procedures   XR Ankle Complete Right   No orders of the defined types were placed in this encounter.     Procedures: No procedures performed   Clinical Data: No additional findings.   Subjective: Chief Complaint  Patient presents with   Right Ankle - Pain, Follow-up    HPI  Review of Systems   Objective: Vital Signs: There were no vitals taken for this visit.  Physical Exam  Ortho Exam  Specialty Comments:  No specialty comments available.  Imaging: XR Ankle Complete Right  Result Date: 05/09/2021 Status post ORIF right ankle fracture without any hardware complications.  Mild posttraumatic arthritic changes.    PMFS History: Patient Active Problem List   Diagnosis Date Noted   Bimalleolar ankle fracture, right, closed, initial encounter 02/23/2020   Past Medical History:  Diagnosis Date   Club foot     No family history on file.  Past Surgical History:  Procedure Laterality Date   CLUB FOOT RELEASE     ORIF ANKLE FRACTURE Right 02/29/2020   Procedure: OPEN REDUCTION INTERNAL FIXATION (ORIF) RIGHT BIMALLEOLAR ANKLE FRACTURE;  Surgeon: Leandrew Koyanagi, MD;  Location: [facility];  Service: Orthopedics;  Laterality: Right;   Social History   Occupational History   Not on file  Tobacco Use   Smoking status: Never   Smokeless tobacco:  Never  Substance and Sexual Activity   Alcohol use: Yes    Comment: rare   Drug use: Never   Sexual activity: Not on file

## 2021-06-25 ENCOUNTER — Other Ambulatory Visit: Payer: Self-pay

## 2021-06-25 ENCOUNTER — Emergency Department (HOSPITAL_COMMUNITY)
Admission: EM | Admit: 2021-06-25 | Discharge: 2021-06-26 | Disposition: A | Discharge status: Home / Self Care | Payer: 59 | Attending: Emergency Medicine | Admitting: Emergency Medicine

## 2021-06-25 ENCOUNTER — Encounter (HOSPITAL_COMMUNITY): Payer: Self-pay

## 2021-06-25 DIAGNOSIS — S61211A Laceration without foreign body of left index finger without damage to nail, initial encounter: Secondary | ICD-10-CM | POA: Insufficient documentation

## 2021-06-25 DIAGNOSIS — Z5321 Procedure and treatment not carried out due to patient leaving prior to being seen by health care provider: Secondary | ICD-10-CM | POA: Insufficient documentation

## 2021-06-25 DIAGNOSIS — Y93G3 Activity, cooking and baking: Secondary | ICD-10-CM | POA: Diagnosis not present

## 2021-06-25 DIAGNOSIS — S6992XA Unspecified injury of left wrist, hand and finger(s), initial encounter: Secondary | ICD-10-CM | POA: Diagnosis present

## 2021-06-25 DIAGNOSIS — W260XXA Contact with knife, initial encounter: Secondary | ICD-10-CM | POA: Insufficient documentation

## 2021-06-25 NOTE — ED Triage Notes (Signed)
Pt report she was cooking dinner and cut the top of her left index finger with a kitchen knife. Bleeding is controlled with quick clot that she got today from [facility]. She went to urgent care and they sent her here because its a deep cut.  ?

## 2021-06-26 NOTE — ED Notes (Signed)
Pt left due to long wait time.  ?

## 2022-11-30 IMAGING — DX DG TIBIA/FIBULA PORT 2V*R*
2 series · 2 of 2 positions shown · non-contrast
Comparison: Films from earlier in the same day.

CLINICAL DATA: History of bimalleolar fracture with reduction

EXAM:
PORTABLE RIGHT TIBIA AND FIBULA - 2 VIEW

[tibia ap]
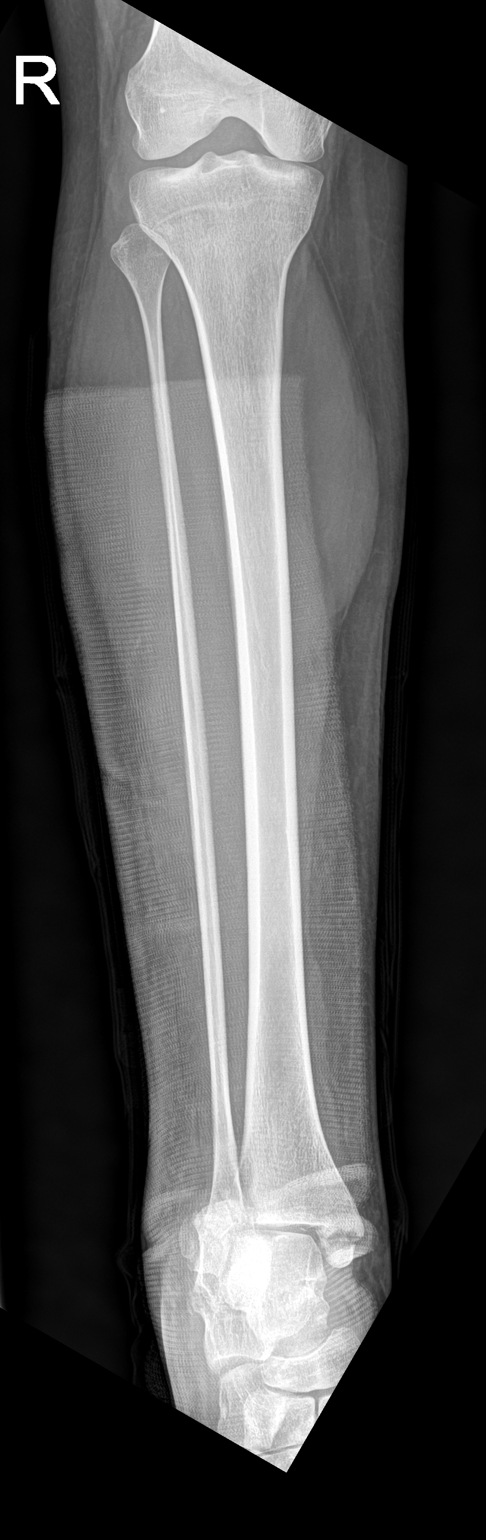

[tibia lat]
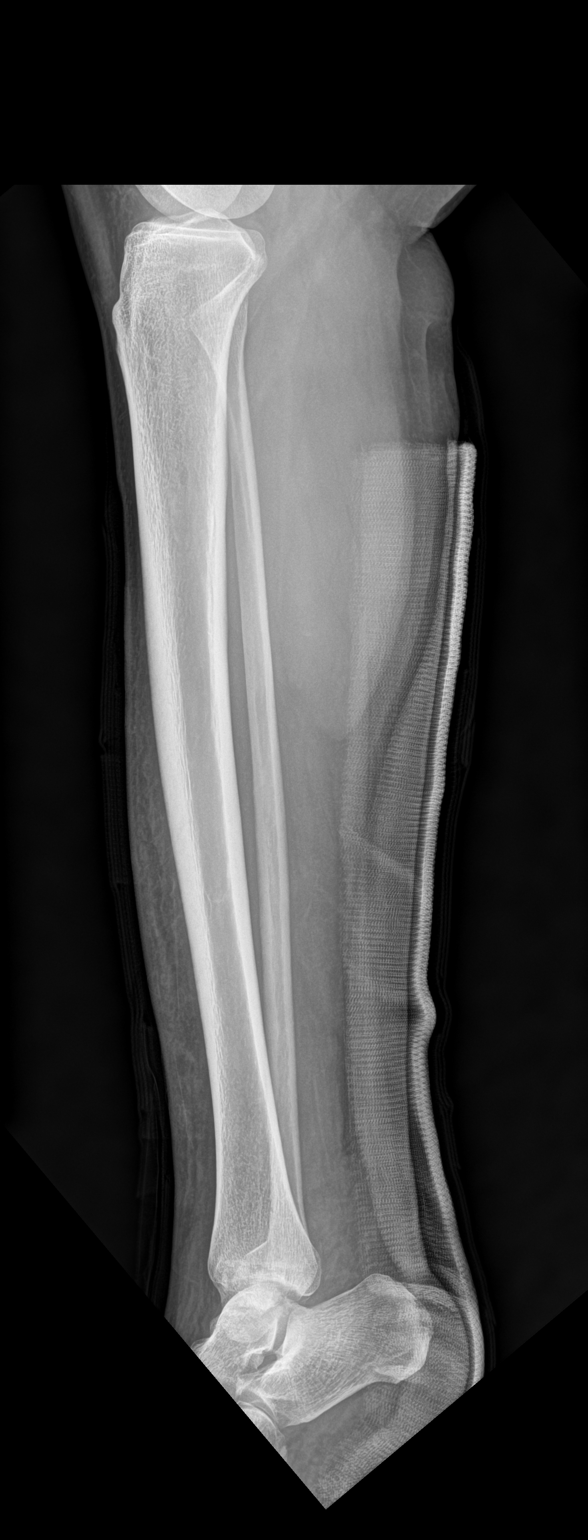

[2 of 2 positions shown; findings below may reference images not displayed]

FINDINGS: Splinting material is noted in place. The bimalleolar fracture is
again identified and mildly reduced. No new focal abnormality is
seen.
IMPRESSION: Status post reduction and splinting of bimalleolar fracture

## 2022-11-30 IMAGING — DX DG ANKLE COMPLETE 3+V*R*
3 series · 3 of 3 positions shown · non-contrast
Comparison: None.

CLINICAL DATA: A small brick wall toppled onto the patient's right
ankle and lower leg today. There is an abrasion that goes from the
anterior midshaft tibia down to the lateral right ankle. Right ankle
is swollen and pt c/o lateral ankle pain. Pt states she has a hx of
club foot on the right side and had surgery as a small child on the
right foot.

EXAM:
RIGHT ANKLE - COMPLETE 3+ VIEW

[ankle ap]
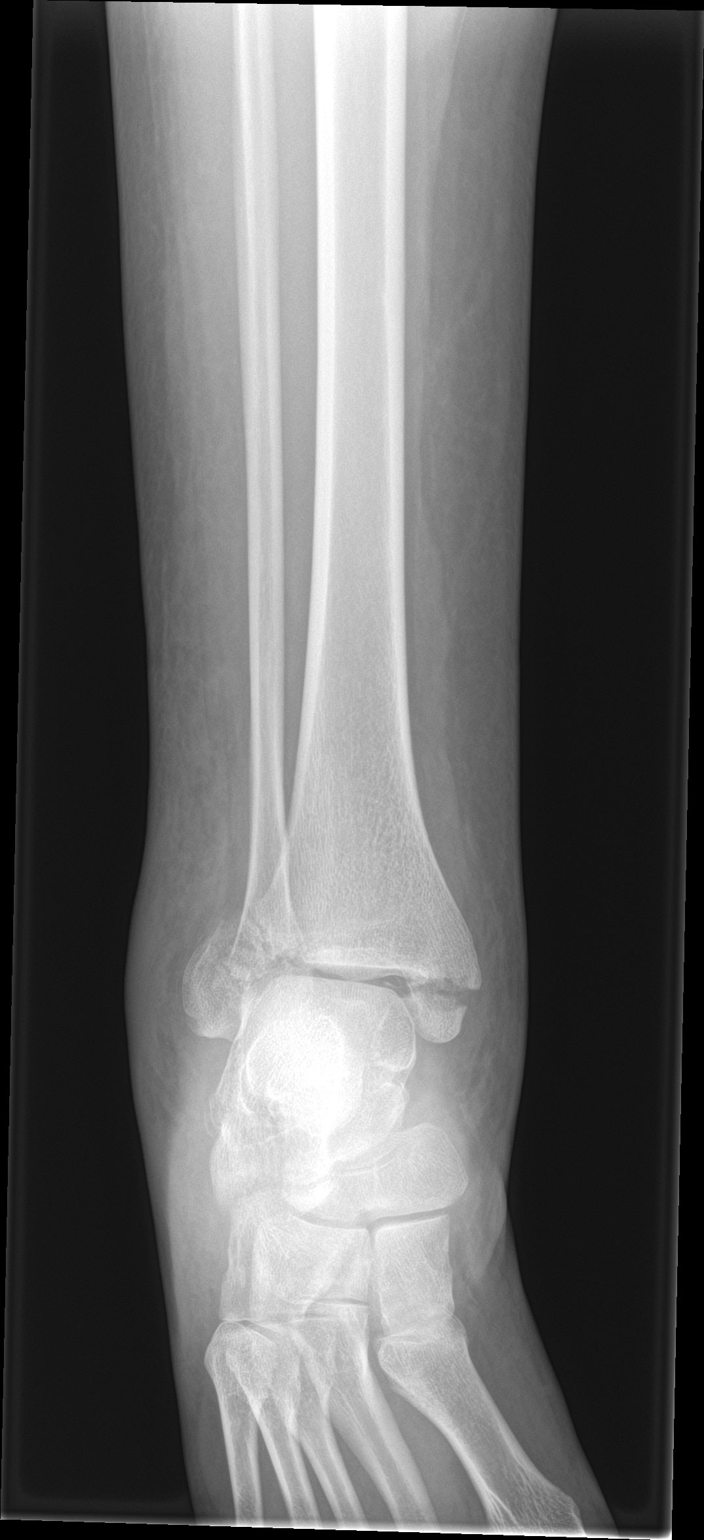

[ankle obl]
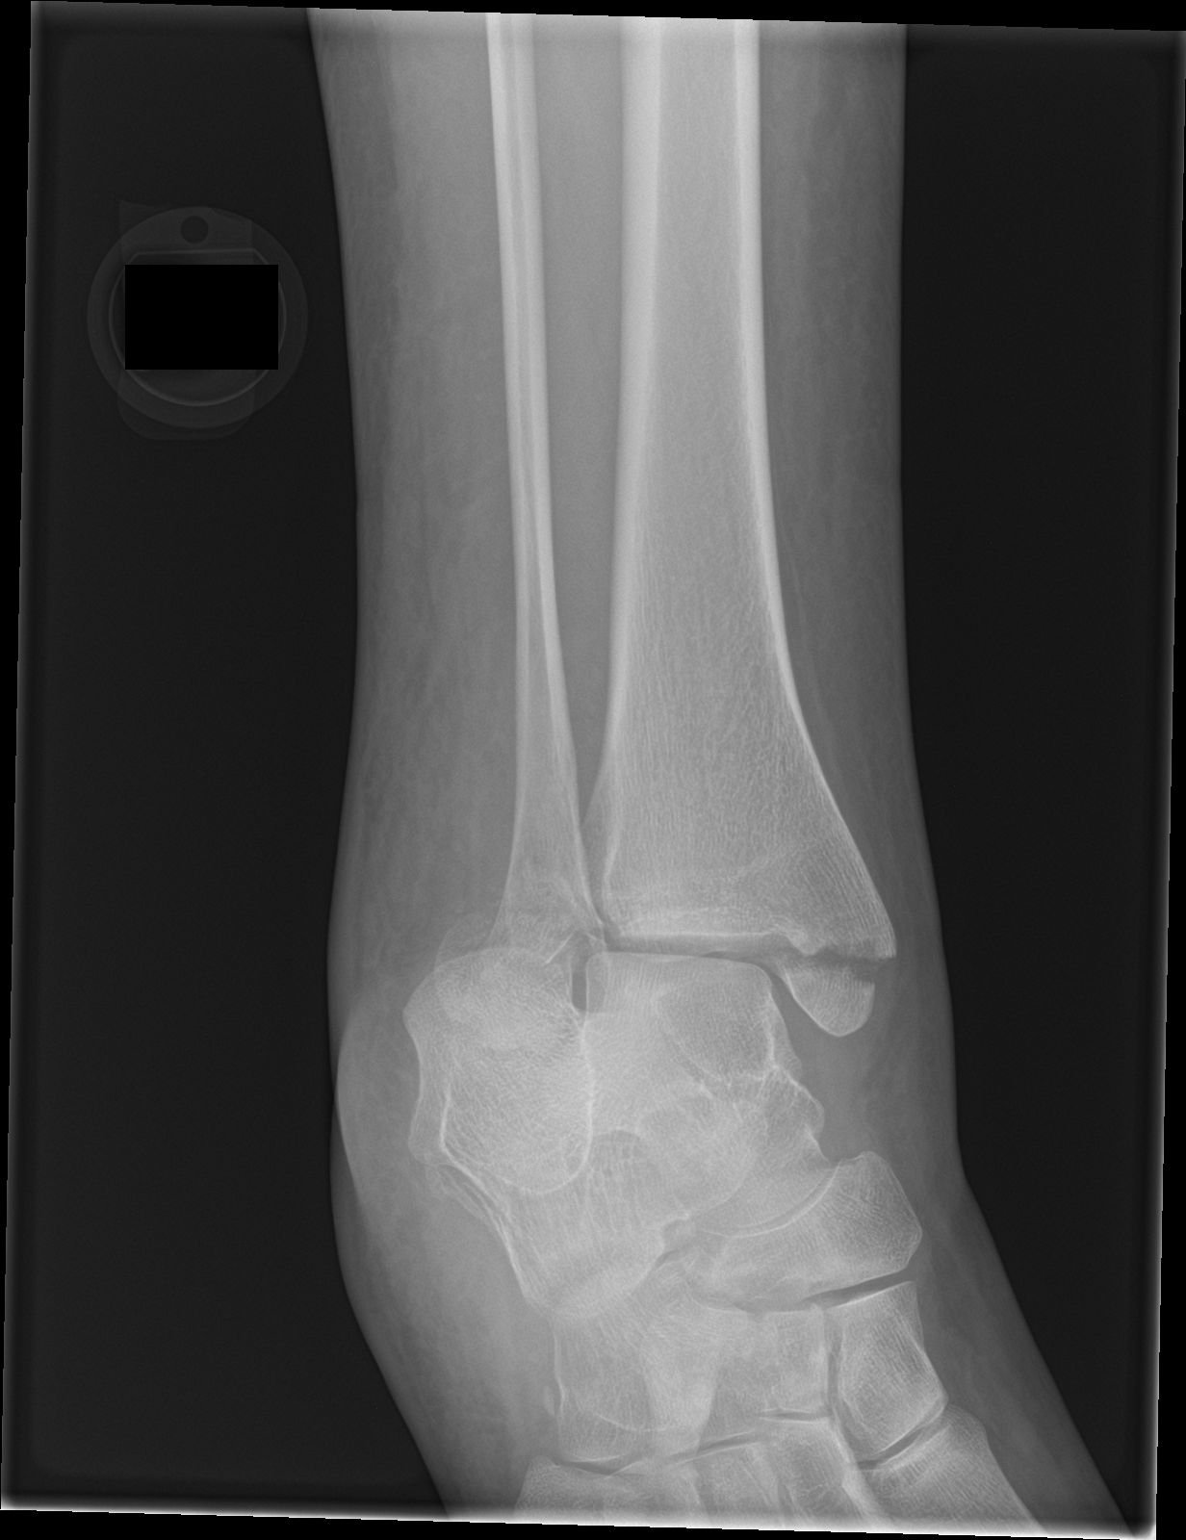

[ankle lat]
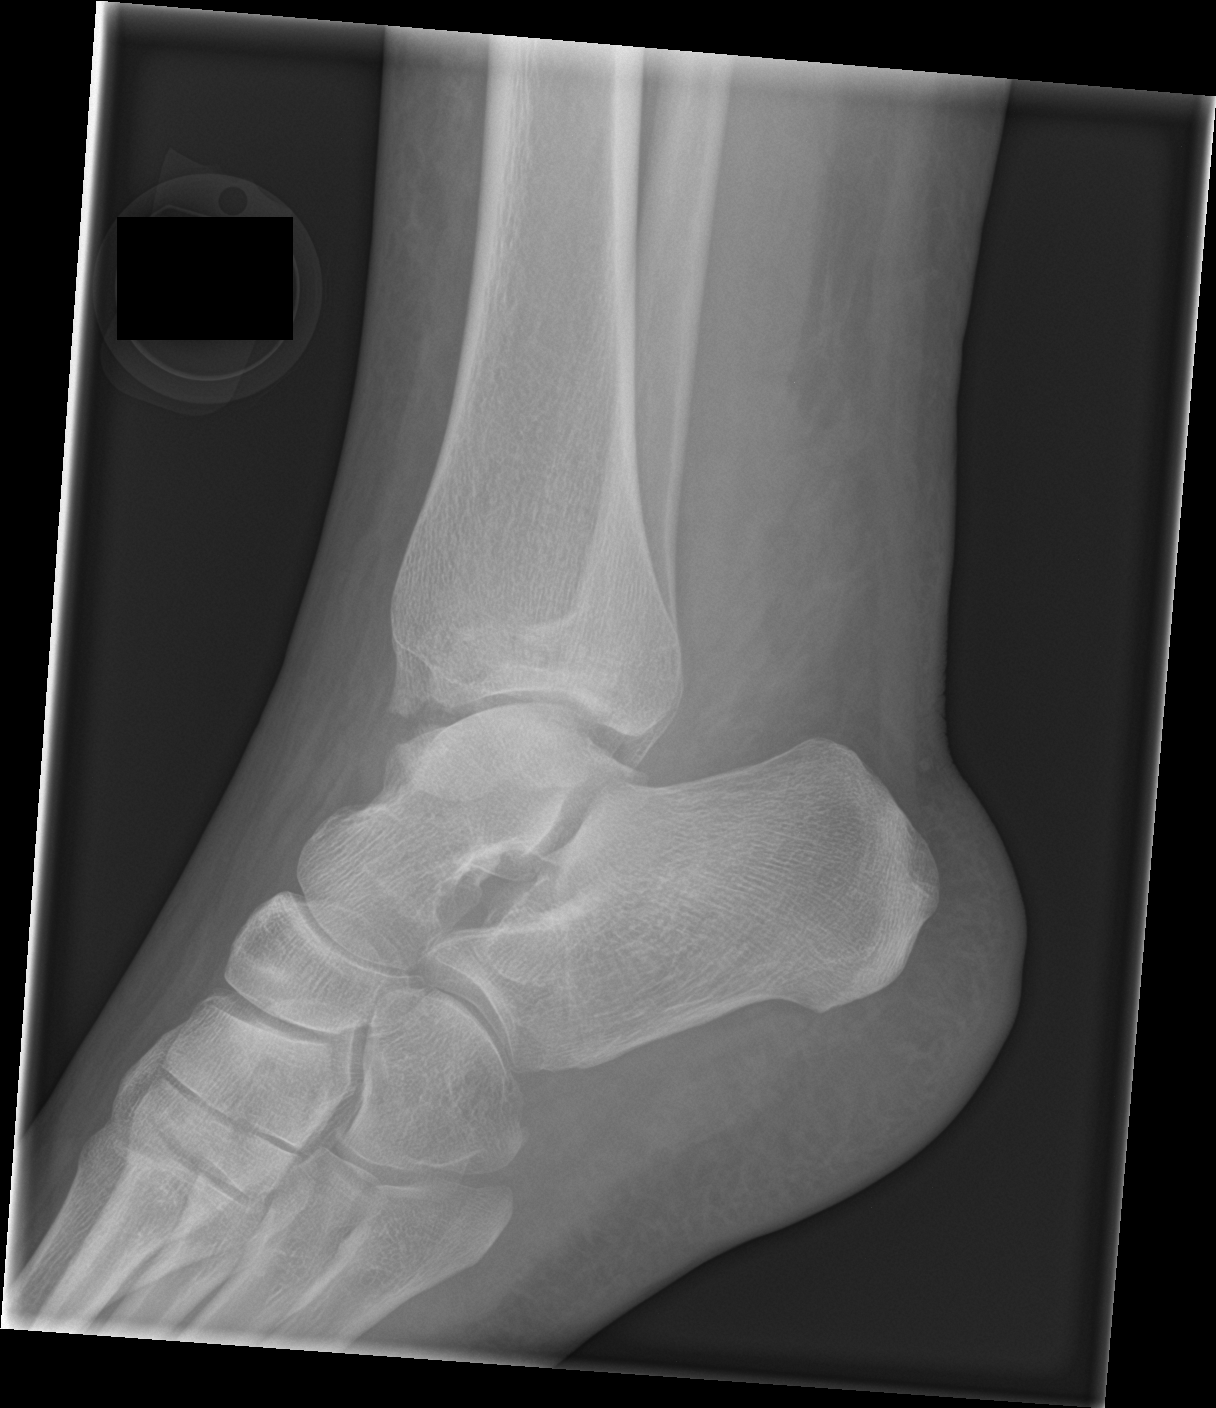

[3 of 3 positions shown; findings below may reference images not displayed]

FINDINGS: Bimalleolar fracture.

There is an oblique fracture of the distal fibula, mildly
comminuted, displaced laterally by 7 mm, distal fragment also mildly
angulated laterally.

Transverse fracture extends across the base of the medial malleolus,
displaced medially by 4 mm. The talus is mildly subluxed laterally,
4-5 mm.

No other fractures.

There is surrounding soft tissue swelling.
IMPRESSION: 1. Fractures of the distal fibula and medial malleolus, displaced
with mild, up approximately 4-5 mm, of lateral talar subluxation.
